# Patient Record
Sex: Female | Born: 1965
Health system: Southern US, Community
[De-identification: ages and names within clinical notes are randomized; demographics above are authoritative.]

## PROBLEM LIST (undated history)

## (undated) DIAGNOSIS — I10 Essential (primary) hypertension: Secondary | ICD-10-CM

## (undated) DIAGNOSIS — R569 Unspecified convulsions: Secondary | ICD-10-CM

## (undated) DIAGNOSIS — C801 Malignant (primary) neoplasm, unspecified: Secondary | ICD-10-CM

## (undated) DIAGNOSIS — E349 Endocrine disorder, unspecified: Secondary | ICD-10-CM

## (undated) DIAGNOSIS — E7800 Pure hypercholesterolemia, unspecified: Secondary | ICD-10-CM

## (undated) HISTORY — PX: MOLE REMOVAL: SHX2046

## (undated) HISTORY — DX: Endocrine disorder, unspecified: E34.9

## (undated) HISTORY — PX: TRIGGER FINGER RELEASE: SHX641

## (undated) HISTORY — DX: Pure hypercholesterolemia, unspecified: E78.00

## (undated) HISTORY — DX: Malignant (primary) neoplasm, unspecified: C80.1

## (undated) HISTORY — DX: Unspecified convulsions: R56.9

## (undated) HISTORY — DX: Essential (primary) hypertension: I10

## (undated) HISTORY — PX: NASAL SINUS SURGERY: SHX719

## (undated) HISTORY — PX: BREAST CYST EXCISION: SHX579

---

## 1998-06-21 ENCOUNTER — Other Ambulatory Visit: Admission: RE | Admit: 1998-06-21 | Discharge: 1998-06-21 | Payer: Self-pay | Admitting: Gynecology

## 1999-03-13 ENCOUNTER — Other Ambulatory Visit: Admission: RE | Admit: 1999-03-13 | Discharge: 1999-03-13 | Payer: Self-pay | Admitting: Gynecology

## 2000-03-12 ENCOUNTER — Other Ambulatory Visit: Admission: RE | Admit: 2000-03-12 | Discharge: 2000-03-12 | Payer: Self-pay | Admitting: Gynecology

## 2000-04-08 ENCOUNTER — Ambulatory Visit (HOSPITAL_COMMUNITY): Admission: RE | Admit: 2000-04-08 | Discharge: 2000-04-08 | Payer: Self-pay | Admitting: Gynecology

## 2000-04-08 ENCOUNTER — Encounter: Payer: Self-pay | Admitting: Gynecology

## 2001-03-12 ENCOUNTER — Other Ambulatory Visit: Admission: RE | Admit: 2001-03-12 | Discharge: 2001-03-12 | Payer: Self-pay | Admitting: Gynecology

## 2001-07-22 ENCOUNTER — Ambulatory Visit (HOSPITAL_COMMUNITY): Admission: RE | Admit: 2001-07-22 | Discharge: 2001-07-22 | Payer: Self-pay | Admitting: Gastroenterology

## 2002-03-29 ENCOUNTER — Other Ambulatory Visit: Admission: RE | Admit: 2002-03-29 | Discharge: 2002-03-29 | Payer: Self-pay | Admitting: Gynecology

## 2003-08-16 ENCOUNTER — Other Ambulatory Visit: Admission: RE | Admit: 2003-08-16 | Discharge: 2003-08-16 | Payer: Self-pay | Admitting: Gynecology

## 2004-08-20 ENCOUNTER — Other Ambulatory Visit: Admission: RE | Admit: 2004-08-20 | Discharge: 2004-08-20 | Payer: Self-pay | Admitting: Gynecology

## 2005-08-22 ENCOUNTER — Other Ambulatory Visit: Admission: RE | Admit: 2005-08-22 | Discharge: 2005-08-22 | Payer: Self-pay | Admitting: Gynecology

## 2006-08-26 ENCOUNTER — Other Ambulatory Visit: Admission: RE | Admit: 2006-08-26 | Discharge: 2006-08-26 | Payer: Self-pay | Admitting: Gynecology

## 2007-11-25 DIAGNOSIS — C801 Malignant (primary) neoplasm, unspecified: Secondary | ICD-10-CM

## 2007-11-25 HISTORY — DX: Malignant (primary) neoplasm, unspecified: C80.1

## 2007-11-25 HISTORY — PX: OTHER SURGICAL HISTORY: SHX169

## 2009-01-24 ENCOUNTER — Ambulatory Visit: Payer: Self-pay | Admitting: Gynecology

## 2009-01-24 ENCOUNTER — Encounter: Payer: Self-pay | Admitting: Gynecology

## 2009-01-24 ENCOUNTER — Encounter: Admission: RE | Admit: 2009-01-24 | Discharge: 2009-01-24 | Payer: Self-pay | Admitting: Gynecology

## 2009-01-24 ENCOUNTER — Other Ambulatory Visit: Admission: RE | Admit: 2009-01-24 | Discharge: 2009-01-24 | Payer: Self-pay | Admitting: Gynecology

## 2010-01-28 ENCOUNTER — Other Ambulatory Visit: Admission: RE | Admit: 2010-01-28 | Discharge: 2010-01-28 | Payer: Self-pay | Admitting: Gynecology

## 2010-01-28 ENCOUNTER — Ambulatory Visit: Payer: Self-pay | Admitting: Gynecology

## 2010-01-28 ENCOUNTER — Encounter: Admission: RE | Admit: 2010-01-28 | Discharge: 2010-01-28 | Payer: Self-pay | Admitting: Gynecology

## 2011-02-06 ENCOUNTER — Other Ambulatory Visit: Payer: Self-pay | Admitting: Gynecology

## 2011-02-06 DIAGNOSIS — Z1231 Encounter for screening mammogram for malignant neoplasm of breast: Secondary | ICD-10-CM

## 2011-02-14 ENCOUNTER — Other Ambulatory Visit (HOSPITAL_COMMUNITY)
Admission: RE | Admit: 2011-02-14 | Discharge: 2011-02-14 | Disposition: A | Discharge status: Home / Self Care | Payer: PRIVATE HEALTH INSURANCE | Source: Ambulatory Visit | Attending: Gynecology | Admitting: Gynecology

## 2011-02-14 ENCOUNTER — Ambulatory Visit
Admission: RE | Admit: 2011-02-14 | Discharge: 2011-02-14 | Disposition: A | Discharge status: Home / Self Care | Payer: PRIVATE HEALTH INSURANCE | Source: Ambulatory Visit | Attending: Gynecology | Admitting: Gynecology

## 2011-02-14 ENCOUNTER — Encounter (INDEPENDENT_AMBULATORY_CARE_PROVIDER_SITE_OTHER): Payer: PRIVATE HEALTH INSURANCE | Admitting: Gynecology

## 2011-02-14 ENCOUNTER — Other Ambulatory Visit: Payer: Self-pay | Admitting: Gynecology

## 2011-02-14 DIAGNOSIS — Z124 Encounter for screening for malignant neoplasm of cervix: Secondary | ICD-10-CM | POA: Insufficient documentation

## 2011-02-14 DIAGNOSIS — B3731 Acute candidiasis of vulva and vagina: Secondary | ICD-10-CM

## 2011-02-14 DIAGNOSIS — Z01419 Encounter for gynecological examination (general) (routine) without abnormal findings: Secondary | ICD-10-CM

## 2011-02-14 DIAGNOSIS — R141 Gas pain: Secondary | ICD-10-CM

## 2011-02-14 DIAGNOSIS — Z1231 Encounter for screening mammogram for malignant neoplasm of breast: Secondary | ICD-10-CM

## 2011-02-14 DIAGNOSIS — B373 Candidiasis of vulva and vagina: Secondary | ICD-10-CM

## 2011-02-14 DIAGNOSIS — L909 Atrophic disorder of skin, unspecified: Secondary | ICD-10-CM

## 2011-02-14 DIAGNOSIS — D259 Leiomyoma of uterus, unspecified: Secondary | ICD-10-CM

## 2011-04-11 NOTE — Procedures (Signed)
[place]. [facility]  Patient:    Rachael Sanford, Rachael Sanford Visit Number: [id number] MRN: [id number]          Service Type: END Location: ENDO Attending Physician:  Charna Elizabeth Proc. Date: 07/22/01 Admit Date:  07/22/2001   CC:         Juan H. Lily Peer, M.D.  Dr. Desmond Dike   Procedure Report  DATE OF BIRTH:  [birth year]  REFERRING PHYSICIAN:  Gaetano Hawthorne. Lily Peer, M.D.  PROCEDURE PERFORMED:  Screening colonoscopy.  ENDOSCOPIST:  Anselmo Rod, M.D.  INSTRUMENT USED:  Olympus video colonoscope.  INDICATIONS FOR PROCEDURE:  The patient is a 45 year old white female with  a family history of colon cancer.  Rule out colonic polyps.  PREPROCEDURE PREPARATION:  Informed consent was procured from the patient. The patient was fasted for eight hours prior to the procedure and prepped with a bottle of magnesium citrate and a gallon of NuLytely the night prior to the procedure.  PREPROCEDURE PHYSICAL:  The patient had stable vital signs.  Neck supple. Chest clear to auscultation.  S1, S2 regular.  Abdomen soft with normal abdominal bowel sounds.  DESCRIPTION OF PROCEDURE:  The patient was placed in the left lateral decubitus position and sedated with 50 mg of Demerol and 5 mg of Versed intravenously.  Once the patient was adequately sedated and maintained on low-flow oxygen and continuous cardiac monitoring, the Olympus video colonoscope was advanced from the rectum to the cecum without difficulty.  The patient had a fairly good prep, no masses, polyps, erosions, ulcerations or diverticula were seen.  There was no evidence of hemorrhoids.  The cecum was clearly visualized.  The appendiceal orifice and the ileocecal valve were photographed.  IMPRESSION:  Healthy-appearing colon.  RECOMMENDATIONS:  Repeat colorectal cancer screening is recommended in five years unless the patient were to develop any abnormal symptoms in the interim.Attending Physician:   Charna Elizabeth DD:  07/22/01 TD:  07/22/01 Job: [id number]

## 2012-02-05 ENCOUNTER — Other Ambulatory Visit: Payer: Self-pay | Admitting: *Deleted

## 2012-02-05 MED ORDER — LEVONORGESTREL-ETHINYL ESTRAD 0.1-20 MG-MCG PO TABS: 1.0000 | ORAL_TABLET | Freq: Every day | ORAL | Status: DC

## 2012-02-16 ENCOUNTER — Encounter: Payer: PRIVATE HEALTH INSURANCE | Admitting: Gynecology

## 2012-02-19 DIAGNOSIS — E7800 Pure hypercholesterolemia, unspecified: Secondary | ICD-10-CM | POA: Insufficient documentation

## 2012-02-24 ENCOUNTER — Encounter: Payer: Self-pay | Admitting: Gynecology

## 2012-02-24 ENCOUNTER — Ambulatory Visit (INDEPENDENT_AMBULATORY_CARE_PROVIDER_SITE_OTHER): Payer: Medicaid Other | Admitting: Gynecology

## 2012-02-24 VITALS — BP 138/82 | Ht 65.0 in | Wt 288.0 lb

## 2012-02-24 DIAGNOSIS — Z01419 Encounter for gynecological examination (general) (routine) without abnormal findings: Secondary | ICD-10-CM

## 2012-02-24 DIAGNOSIS — N920 Excessive and frequent menstruation with regular cycle: Secondary | ICD-10-CM

## 2012-02-24 DIAGNOSIS — E663 Overweight: Secondary | ICD-10-CM

## 2012-02-24 DIAGNOSIS — E119 Type 2 diabetes mellitus without complications: Secondary | ICD-10-CM

## 2012-02-24 DIAGNOSIS — N946 Dysmenorrhea, unspecified: Secondary | ICD-10-CM

## 2012-02-24 DIAGNOSIS — E282 Polycystic ovarian syndrome: Secondary | ICD-10-CM

## 2012-02-24 HISTORY — DX: Polycystic ovarian syndrome: E28.2

## 2012-02-24 HISTORY — DX: Type 2 diabetes mellitus without complications: E11.9

## 2012-02-24 HISTORY — DX: Excessive and frequent menstruation with regular cycle: N92.0

## 2012-02-24 HISTORY — DX: Dysmenorrhea, unspecified: N94.6

## 2012-02-24 HISTORY — DX: Overweight: E66.3

## 2012-02-24 MED ORDER — LEVONORGESTREL-ETHINYL ESTRAD 0.1-20 MG-MCG PO TABS: 1.0000 | ORAL_TABLET | Freq: Every day | ORAL | Status: DC

## 2012-02-24 NOTE — Patient Instructions (Signed)
Intrauterine Device Information An intrauterine device (IUD) is inserted into your uterus and prevents pregnancy. There are 2 types of IUDs available:  Copper IUD. This type of IUD is wrapped in copper wire and is placed inside the uterus. Copper makes the uterus and fallopian tubes produce a fluid that kills sperm. The copper IUD can stay in place for 10 years.   Hormone IUD. This type of IUD contains the hormone progestin (synthetic progesterone). The hormone thickens the cervical mucus and prevents sperm from entering the uterus, and it also thins the uterine lining to prevent implantation of a fertilized egg. The hormone can weaken or kill the sperm that get into the uterus. The hormone IUD can stay in place for 5 years.  Your caregiver will make sure you are a good candidate for a contraceptive IUD. Discuss with your caregiver the possible side effects. ADVANTAGES  It is highly effective, reversible, long-acting, and low maintenance.   There are no estrogen-related side effects.   An IUD can be used when breastfeeding.   It is not associated with weight gain.   It works immediately after insertion.   The copper IUD does not interfere with your female hormones.   The progesterone IUD can make heavy menstrual periods lighter.   The progesterone IUD can be used for 5 years.   The copper IUD can be used for 10 years.  DISADVANTAGES  The progesterone IUD can be associated with irregular bleeding patterns.   The copper IUD can make your menstrual flow heavier and more painful.   You may experience cramping and vaginal bleeding after insertion.  Document Released: 10/14/2004 Document Revised: 10/30/2011 Document Reviewed: 03/15/2011 ExitCare Patient Information 2012 ExitCare, [place]   Remember to schedule your mammogram this year

## 2012-02-24 NOTE — Progress Notes (Signed)
Rachael Sanford 10/20/1966 [id number]   History:    46 y.o.  for annual exam who has history of type 2 diabetes, hypercholesterolemia, obesity, PCO S. she has not followup with her internist in over year since she has been unemployed. When asked about her fingerstick blood sugars at home she states it sometimes they go as high as 200. Patient is on low-dose oral contraceptive pill (20 mcg of ethinyl estradiol) because of her severe dysmenorrhea and menorrhagia and PCO S. symptoms. Patient is a nonsmoker. Her last mammogram was in March 2012. Her menstrual cycles reported to be regular. Past medical history,surgical history, family history and social history were all reviewed and documented in the EPIC chart.  Gynecologic History Patient's last menstrual period was 02/01/2012. Contraception: OCP (estrogen/progesterone) Last Pap: 2012. Results were: normal Last mammogram: 2012. Results were: normal  Obstetric History OB History    Grav Para Term Preterm Abortions TAB SAB Ect Mult Living   0                ROS:  Was performed and pertinent positives and negatives are included in the history.  Exam: chaperone present  BP 138/82  Ht 5\' 5"  (1.651 m)  Wt 288 lb (130.636 kg)  BMI 47.93 kg/m2  LMP 02/01/2012  Body mass index is 47.93 kg/(m^2).  General appearance : Well developed well nourished female. No acute distress HEENT: Neck supple, trachea midline, no carotid bruits, no thyroidmegaly Lungs: Clear to auscultation, no rhonchi or wheezes, or rib retractions  Heart: Regular rate and rhythm, no murmurs or gallops Breast:Examined in sitting and supine position were symmetrical in appearance, no palpable masses or tenderness,  no skin retraction, no nipple inversion, no nipple discharge, no skin discoloration, no axillary or supraclavicular lymphadenopathy Abdomen: no palpable masses or tenderness, no rebound or guarding Extremities: no edema or skin discoloration or  tenderness  Pelvic:  Bartholin, Urethra, Skene Glands: Within normal limits             Vagina: No gross lesions or discharge  Cervix: No gross lesions or discharge  Uterus difficult to assess due to patient's abdominal girth and morbid obesity  Adnexa difficult to assess due to patient's abdominal girth and morbid obesity  Anus and perineum  normal   Rectovaginal  normal sphincter tone without palpated masses or tenderness             Hemoccult not indicated, digital rectal exam no palpable masses     Assessment/Plan:  46 y.o. female for annual exam with metabolic syndrome/PCO S. and morbid obesity. Bimanual examination very limited due to patient's abdominal girth (BMI 47.93). Patient will return back in 2 weeks for an ultrasound for better assessment of her uterus and adnexa. Pap smear not done today. New screening guidelines discussed. She will make arrangements to followup with her internist in [facility] as to her hypercholesterolemia and diabetes for blood work and adjustment of medications. Requisition was provided for her to schedule her mammogram. We discussed once again the risk of oral contraceptive pills to include the risk of DVT and pulmonary embolism. She is a high risk as a result of her metabolic syndrome and obesity. She states that she cannot do without them  because her menstrual cycles are very heavy with severe cramping and mood swings and accepts the risks. Will followup in 2 weeks for the ultrasound.    Ok Edwards MD, 2:01 PM 02/24/2012

## 2012-03-10 ENCOUNTER — Ambulatory Visit (INDEPENDENT_AMBULATORY_CARE_PROVIDER_SITE_OTHER): Payer: Medicaid Other

## 2012-03-10 ENCOUNTER — Ambulatory Visit (INDEPENDENT_AMBULATORY_CARE_PROVIDER_SITE_OTHER): Payer: Medicaid Other | Admitting: Gynecology

## 2012-03-10 DIAGNOSIS — E282 Polycystic ovarian syndrome: Secondary | ICD-10-CM

## 2012-03-10 DIAGNOSIS — N946 Dysmenorrhea, unspecified: Secondary | ICD-10-CM

## 2012-03-10 DIAGNOSIS — D251 Intramural leiomyoma of uterus: Secondary | ICD-10-CM

## 2012-03-10 DIAGNOSIS — E663 Overweight: Secondary | ICD-10-CM

## 2012-03-10 DIAGNOSIS — D259 Leiomyoma of uterus, unspecified: Secondary | ICD-10-CM

## 2012-03-10 DIAGNOSIS — N920 Excessive and frequent menstruation with regular cycle: Secondary | ICD-10-CM

## 2012-03-10 MED ORDER — LEVONORGESTREL-ETHINYL ESTRAD 0.1-20 MG-MCG PO TABS: 1.0000 | ORAL_TABLET | Freq: Every day | ORAL | Status: DC

## 2012-03-10 NOTE — Progress Notes (Signed)
Patient 46 year old morbidly obese with metabolic syndrome presented today for an ultrasound due to limited exam at time of her annual gynecological examination on April 2. Patient doing well otherwise and it was recommended she followup with her internist because of her wide swings in her blood sugar fingerstick which she had tested at home which she had reported to me on last office visit. She stated i it had been as high as 200 at one time. See patient medication list.  Ultrasound uterus measured 9 x 5.8 x 4.6 cm endometrial stripe 4.3 mm a small fundal myoma measuring 24 x 26 mm was noted displacing the endometrial cavity right and left or otherwise normal. This fibroid has been same size for several years.  Because of patient's PCO S. she has been on low dose oral contraceptive pill with a 20 mcg pill. We had offered her to consider an IUD because of her history of being obese with metabolic syndrome and her potential risk of DVT and pulmonary embolism. She states that she accepts the risk and would like to continue on her oral contraceptive pill because it has helped regulate her cycles and has minimized her cramping as well. The Alesse 28 day oral contraceptive pill was prescribed. We'll see her back in one year or when necessary. She will followup with her internist.

## 2012-03-10 NOTE — Patient Instructions (Signed)
Cholesterol Control Diet  Cholesterol levels in your body are determined significantly by your diet. Cholesterol levels may also be related to heart disease. The following material helps to explain this relationship and discusses what you can do to help keep your heart healthy. Not all cholesterol is bad. Low-density lipoprotein (LDL) cholesterol is the "bad" cholesterol. It may cause fatty deposits to build up inside your arteries. High-density lipoprotein (HDL) cholesterol is "good." It helps to remove the "bad" LDL cholesterol from your blood. Cholesterol is a very important risk factor for heart disease. Other risk factors are high blood pressure, smoking, stress, heredity, and weight. The heart muscle gets its supply of blood through the coronary arteries. If your LDL cholesterol is high and your HDL cholesterol is low, you are at risk for having fatty deposits build up in your coronary arteries. This leaves less room through which blood can flow. Without sufficient blood and oxygen, the heart muscle cannot function properly and you may feel chest pains (angina pectoris). When a coronary artery closes up entirely, a part of the heart muscle may die, causing a heart attack (myocardial infarction). CHECKING CHOLESTEROL When your caregiver sends your blood to a lab to be analyzed for cholesterol, a complete lipid (fat) profile may be done. With this test, the total amount of cholesterol and levels of LDL and HDL are determined. Triglycerides are a type of fat that circulates in the blood and can also be used to determine heart disease risk. The list below describes what the numbers should be: Test: Total Cholesterol.  Less than 200 mg/dl.  Test: LDL "bad cholesterol."  Less than 100 mg/dl.   Less than 70 mg/dl if you are at very high risk of a heart attack or sudden cardiac death.  Test: HDL "good cholesterol."  Greater than 50 mg/dl for women.    Greater than 40 mg/dl for men.  Test: Triglycerides.  Less than 150 mg/dl.  CONTROLLING CHOLESTEROL WITH DIET Although exercise and lifestyle factors are important, your diet is key. That is because certain foods are known to raise cholesterol and others to lower it. The goal is to balance foods for their effect on cholesterol and more importantly, to replace saturated and trans fat with other types of fat, such as monounsaturated fat, polyunsaturated fat, and omega-3 fatty acids. On average, a person should consume no more than 15 to 17 g of saturated fat daily. Saturated and trans fats are considered "bad" fats, and they will raise LDL cholesterol. Saturated fats are primarily found in animal products such as meats, butter, and cream. However, that does not mean you need to sacrifice all your favorite foods. Today, there are good tasting, low-fat, low-cholesterol substitutes for most of the things you like to eat. Choose low-fat or nonfat alternatives. Choose round or loin cuts of red meat, since these types of cuts are lowest in fat and cholesterol. Chicken (without the skin), fish, veal, and ground turkey breast are excellent choices. Eliminate fatty meats, such as hot dogs and salami. Even shellfish have little or no saturated fat. Have a 3 oz (85 g) portion when you eat lean meat, poultry, or fish. Trans fats are also called "partially hydrogenated oils." They are oils that have been scientifically manipulated so that they are solid at room temperature resulting in a longer shelf life and improved taste and texture of foods in which they are added. Trans fats are found in stick margarine, some tub margarines, cookies, crackers, and baked goods.    When baking and cooking, oils are an excellent substitute for butter. The monounsaturated oils are especially beneficial since it is believed they lower LDL and raise HDL. The oils you should avoid entirely are saturated tropical oils, such as coconut and  palm.  Remember to eat liberally from food groups that are naturally free of saturated and trans fat, including fish, fruit, vegetables, beans, grains (barley, rice, couscous, bulgur wheat), and pasta (without cream sauces).  IDENTIFYING FOODS THAT LOWER CHOLESTEROL  Soluble fiber may lower your cholesterol. This type of fiber is found in fruits such as apples, vegetables such as broccoli, potatoes, and carrots, legumes such as beans, peas, and lentils, and grains such as barley. Foods fortified with plant sterols (phytosterol) may also lower cholesterol. You should eat at least 2 g per day of these foods for a cholesterol lowering effect.  Read package labels to identify low-saturated fats, trans fats free, and low-fat foods at the supermarket. Select cheeses that have only 2 to 3 g saturated fat per ounce. Use a heart-healthy tub margarine that is free of trans fats or partially hydrogenated oil. When buying baked goods (cookies, crackers), avoid partially hydrogenated oils. Breads and muffins should be made from whole grains (whole-wheat or whole oat flour, instead of "flour" or "enriched flour"). Buy non-creamy canned soups with reduced salt and no added fats.  FOOD PREPARATION TECHNIQUES  Never deep-fry. If you must fry, either stir-fry, which uses very little fat, or use non-stick cooking sprays. When possible, broil, bake, or roast meats, and steam vegetables. Instead of dressing vegetables with butter or margarine, use lemon and herbs, applesauce and cinnamon (for squash and sweet potatoes), nonfat yogurt, salsa, and low-fat dressings for salads.  LOW-SATURATED FAT / LOW-FAT FOOD SUBSTITUTES Meats / Saturated Fat (g)  Avoid: Steak, marbled (3 oz/85 g) / 11 g   Choose: Steak, lean (3 oz/85 g) / 4 g   Avoid: Hamburger (3 oz/85 g) / 7 g   Choose: Hamburger, lean (3 oz/85 g) / 5 g   Avoid: Ham (3 oz/85 g) / 6 g   Choose: Ham, lean cut (3 oz/85 g) / 2.4 g   Avoid: Chicken, with skin, dark  meat (3 oz/85 g) / 4 g   Choose: Chicken, skin removed, dark meat (3 oz/85 g) / 2 g   Avoid: Chicken, with skin, light meat (3 oz/85 g) / 2.5 g   Choose: Chicken, skin removed, light meat (3 oz/85 g) / 1 g  Dairy / Saturated Fat (g)  Avoid: Whole milk (1 cup) / 5 g   Choose: Low-fat milk, 2% (1 cup) / 3 g   Choose: Low-fat milk, 1% (1 cup) / 1.5 g   Choose: Skim milk (1 cup) / 0.3 g   Avoid: Hard cheese (1 oz/28 g) / 6 g   Choose: Skim milk cheese (1 oz/28 g) / 2 to 3 g   Avoid: Cottage cheese, 4% fat (1 cup) / 6.5 g   Choose: Low-fat cottage cheese, 1% fat (1 cup) / 1.5 g   Avoid: Ice cream (1 cup) / 9 g   Choose: Sherbet (1 cup) / 2.5 g   Choose: Nonfat frozen yogurt (1 cup) / 0.3 g   Choose: Frozen fruit bar / trace   Avoid: Whipped cream (1 tbs) / 3.5 g   Choose: Nondairy whipped topping (1 tbs) / 1 g  Condiments / Saturated Fat (g)  Avoid: Mayonnaise (1 tbs) / 2 g   Choose: Low-fat   mayonnaise (1 tbs) / 1 g   Avoid: Butter (1 tbs) / 7 g   Choose: Extra light margarine (1 tbs) / 1 g   Avoid: Coconut oil (1 tbs) / 11.8 g   Choose: Olive oil (1 tbs) / 1.8 g   Choose: Corn oil (1 tbs) / 1.7 g   Choose: Safflower oil (1 tbs) / 1.2 g   Choose: Sunflower oil (1 tbs) / 1.4 g   Choose: Soybean oil (1 tbs) / 2.4 g   Choose: Canola oil (1 tbs) / 1 g  Document Released: 11/10/2005 Document Revised: 07/23/2011 Document Reviewed: 05/01/2011 Exit[employer] Patient Information 2012 [employer].  Exercise to Lose Weight Exercise and a healthy diet may help you lose weight. Your doctor may suggest specific exercises. EXERCISE IDEAS AND TIPS  Choose low-cost things you enjoy doing, such as walking, bicycling, or exercising to workout videos.   Take stairs instead of the elevator.   Walk during your lunch break.   Park your car further away from work or school.   Go to a gym or an exercise class.   Start with 5 to 10 minutes of exercise each day. Build up to  30 minutes of exercise 4 to 6 days a week.   Wear shoes with good support and comfortable clothes.   Stretch before and after working out.   Work out until you breathe harder and your heart beats faster.   Drink extra water when you exercise.   Do not do so much that you hurt yourself, feel dizzy, or get very short of breath.  Exercises that burn about 150 calories:  Running 1  miles in 15 minutes.   Playing volleyball for 45 to 60 minutes.   Washing and waxing a car for 45 to 60 minutes.   Playing touch football for 45 minutes.   Walking 1  miles in 35 minutes.   Pushing a stroller 1  miles in 30 minutes.   Playing basketball for 30 minutes.   Raking leaves for 30 minutes.   Bicycling 5 miles in 30 minutes.   Walking 2 miles in 30 minutes.   Dancing for 30 minutes.   Shoveling snow for 15 minutes.   Swimming laps for 20 minutes.   Walking up stairs for 15 minutes.   Bicycling 4 miles in 15 minutes.   Gardening for 30 to 45 minutes.   Jumping rope for 15 minutes.   Washing windows or floors for 45 to 60 minutes.  Document Released: 12/13/2010 Document Revised: 07/23/2011 Document Reviewed: 12/13/2010 [employer] Patient Information 2012 [employer].  

## 2014-08-17 ENCOUNTER — Other Ambulatory Visit: Payer: Self-pay

## 2014-08-17 DIAGNOSIS — Z1231 Encounter for screening mammogram for malignant neoplasm of breast: Secondary | ICD-10-CM

## 2014-09-06 ENCOUNTER — Ambulatory Visit
Admission: RE | Admit: 2014-09-06 | Discharge: 2014-09-06 | Disposition: A | Discharge status: Home / Self Care | Payer: PRIVATE HEALTH INSURANCE | Source: Ambulatory Visit

## 2014-09-06 DIAGNOSIS — Z1231 Encounter for screening mammogram for malignant neoplasm of breast: Secondary | ICD-10-CM

## 2015-07-05 ENCOUNTER — Other Ambulatory Visit: Payer: Self-pay

## 2015-07-05 DIAGNOSIS — Z1231 Encounter for screening mammogram for malignant neoplasm of breast: Secondary | ICD-10-CM

## 2015-09-11 ENCOUNTER — Ambulatory Visit
Admission: RE | Admit: 2015-09-11 | Discharge: 2015-09-11 | Disposition: A | Discharge status: Home / Self Care | Payer: BLUE CROSS/BLUE SHIELD | Source: Ambulatory Visit

## 2015-09-11 DIAGNOSIS — Z1231 Encounter for screening mammogram for malignant neoplasm of breast: Secondary | ICD-10-CM

## 2016-02-24 DIAGNOSIS — J0100 Acute maxillary sinusitis, unspecified: Secondary | ICD-10-CM | POA: Diagnosis not present

## 2016-04-06 DIAGNOSIS — J0100 Acute maxillary sinusitis, unspecified: Secondary | ICD-10-CM | POA: Diagnosis not present

## 2016-05-05 DIAGNOSIS — Z800 Family history of malignant neoplasm of digestive organs: Secondary | ICD-10-CM | POA: Diagnosis not present

## 2016-05-20 DIAGNOSIS — J0100 Acute maxillary sinusitis, unspecified: Secondary | ICD-10-CM | POA: Diagnosis not present

## 2016-05-20 DIAGNOSIS — H6980 Other specified disorders of Eustachian tube, unspecified ear: Secondary | ICD-10-CM | POA: Diagnosis not present

## 2016-06-02 DIAGNOSIS — J329 Chronic sinusitis, unspecified: Secondary | ICD-10-CM | POA: Diagnosis not present

## 2016-06-02 DIAGNOSIS — J019 Acute sinusitis, unspecified: Secondary | ICD-10-CM | POA: Diagnosis not present

## 2016-06-06 DIAGNOSIS — E7800 Pure hypercholesterolemia, unspecified: Secondary | ICD-10-CM | POA: Diagnosis not present

## 2016-06-06 DIAGNOSIS — Z794 Long term (current) use of insulin: Secondary | ICD-10-CM | POA: Diagnosis not present

## 2016-06-06 DIAGNOSIS — K621 Rectal polyp: Secondary | ICD-10-CM | POA: Diagnosis not present

## 2016-06-06 DIAGNOSIS — D122 Benign neoplasm of ascending colon: Secondary | ICD-10-CM | POA: Diagnosis not present

## 2016-06-06 DIAGNOSIS — Z1211 Encounter for screening for malignant neoplasm of colon: Secondary | ICD-10-CM | POA: Diagnosis not present

## 2016-06-06 DIAGNOSIS — K644 Residual hemorrhoidal skin tags: Secondary | ICD-10-CM | POA: Diagnosis not present

## 2016-06-06 DIAGNOSIS — Z79899 Other long term (current) drug therapy: Secondary | ICD-10-CM | POA: Diagnosis not present

## 2016-06-06 DIAGNOSIS — Z7982 Long term (current) use of aspirin: Secondary | ICD-10-CM | POA: Diagnosis not present

## 2016-06-06 DIAGNOSIS — Z800 Family history of malignant neoplasm of digestive organs: Secondary | ICD-10-CM | POA: Diagnosis not present

## 2016-06-06 DIAGNOSIS — E282 Polycystic ovarian syndrome: Secondary | ICD-10-CM | POA: Diagnosis not present

## 2016-06-06 DIAGNOSIS — D126 Benign neoplasm of colon, unspecified: Secondary | ICD-10-CM | POA: Diagnosis not present

## 2016-06-06 DIAGNOSIS — E119 Type 2 diabetes mellitus without complications: Secondary | ICD-10-CM | POA: Diagnosis not present

## 2016-06-06 DIAGNOSIS — I10 Essential (primary) hypertension: Secondary | ICD-10-CM | POA: Diagnosis not present

## 2016-06-10 DIAGNOSIS — E785 Hyperlipidemia, unspecified: Secondary | ICD-10-CM | POA: Diagnosis not present

## 2016-06-10 DIAGNOSIS — E1165 Type 2 diabetes mellitus with hyperglycemia: Secondary | ICD-10-CM | POA: Diagnosis not present

## 2016-06-10 DIAGNOSIS — E1140 Type 2 diabetes mellitus with diabetic neuropathy, unspecified: Secondary | ICD-10-CM | POA: Diagnosis not present

## 2016-06-10 DIAGNOSIS — E0840 Diabetes mellitus due to underlying condition with diabetic neuropathy, unspecified: Secondary | ICD-10-CM | POA: Diagnosis not present

## 2016-06-10 DIAGNOSIS — Z794 Long term (current) use of insulin: Secondary | ICD-10-CM | POA: Diagnosis not present

## 2016-06-16 DIAGNOSIS — J329 Chronic sinusitis, unspecified: Secondary | ICD-10-CM | POA: Diagnosis not present

## 2016-06-16 DIAGNOSIS — J019 Acute sinusitis, unspecified: Secondary | ICD-10-CM | POA: Diagnosis not present

## 2016-06-23 ENCOUNTER — Other Ambulatory Visit: Payer: Self-pay | Admitting: Family Medicine

## 2016-06-23 DIAGNOSIS — Z1231 Encounter for screening mammogram for malignant neoplasm of breast: Secondary | ICD-10-CM

## 2016-06-25 DIAGNOSIS — E119 Type 2 diabetes mellitus without complications: Secondary | ICD-10-CM | POA: Diagnosis not present

## 2016-06-25 DIAGNOSIS — I10 Essential (primary) hypertension: Secondary | ICD-10-CM | POA: Diagnosis not present

## 2016-06-25 DIAGNOSIS — H524 Presbyopia: Secondary | ICD-10-CM | POA: Diagnosis not present

## 2016-06-30 DIAGNOSIS — J329 Chronic sinusitis, unspecified: Secondary | ICD-10-CM | POA: Diagnosis not present

## 2016-07-09 DIAGNOSIS — L0390 Cellulitis, unspecified: Secondary | ICD-10-CM | POA: Diagnosis not present

## 2016-07-09 DIAGNOSIS — N611 Abscess of the breast and nipple: Secondary | ICD-10-CM | POA: Diagnosis not present

## 2016-08-31 DIAGNOSIS — J0100 Acute maxillary sinusitis, unspecified: Secondary | ICD-10-CM | POA: Diagnosis not present

## 2016-09-11 ENCOUNTER — Ambulatory Visit
Admission: RE | Admit: 2016-09-11 | Discharge: 2016-09-11 | Disposition: A | Discharge status: Home / Self Care | Payer: BLUE CROSS/BLUE SHIELD | Source: Ambulatory Visit | Attending: Family Medicine | Admitting: Family Medicine

## 2016-09-11 DIAGNOSIS — L600 Ingrowing nail: Secondary | ICD-10-CM | POA: Insufficient documentation

## 2016-09-11 DIAGNOSIS — L84 Corns and callosities: Secondary | ICD-10-CM | POA: Insufficient documentation

## 2016-09-11 DIAGNOSIS — E1142 Type 2 diabetes mellitus with diabetic polyneuropathy: Secondary | ICD-10-CM

## 2016-09-11 DIAGNOSIS — Z1231 Encounter for screening mammogram for malignant neoplasm of breast: Secondary | ICD-10-CM | POA: Diagnosis not present

## 2016-09-11 DIAGNOSIS — E1169 Type 2 diabetes mellitus with other specified complication: Secondary | ICD-10-CM | POA: Diagnosis not present

## 2016-09-11 HISTORY — DX: Corns and callosities: L84

## 2016-09-11 HISTORY — DX: Ingrowing nail: L60.0

## 2016-09-11 HISTORY — DX: Type 2 diabetes mellitus with diabetic polyneuropathy: E11.42

## 2016-09-15 DIAGNOSIS — E1165 Type 2 diabetes mellitus with hyperglycemia: Secondary | ICD-10-CM | POA: Diagnosis not present

## 2016-09-15 DIAGNOSIS — E1140 Type 2 diabetes mellitus with diabetic neuropathy, unspecified: Secondary | ICD-10-CM | POA: Diagnosis not present

## 2016-09-15 DIAGNOSIS — G629 Polyneuropathy, unspecified: Secondary | ICD-10-CM | POA: Diagnosis not present

## 2016-09-15 DIAGNOSIS — Z794 Long term (current) use of insulin: Secondary | ICD-10-CM | POA: Diagnosis not present

## 2016-10-21 DIAGNOSIS — J0100 Acute maxillary sinusitis, unspecified: Secondary | ICD-10-CM | POA: Diagnosis not present

## 2016-10-27 DIAGNOSIS — R0981 Nasal congestion: Secondary | ICD-10-CM | POA: Diagnosis not present

## 2016-10-27 DIAGNOSIS — J342 Deviated nasal septum: Secondary | ICD-10-CM | POA: Diagnosis not present

## 2016-10-27 DIAGNOSIS — J329 Chronic sinusitis, unspecified: Secondary | ICD-10-CM | POA: Diagnosis not present

## 2016-10-27 DIAGNOSIS — J3489 Other specified disorders of nose and nasal sinuses: Secondary | ICD-10-CM | POA: Diagnosis not present

## 2016-11-10 DIAGNOSIS — J209 Acute bronchitis, unspecified: Secondary | ICD-10-CM | POA: Diagnosis not present

## 2016-11-20 DIAGNOSIS — D485 Neoplasm of uncertain behavior of skin: Secondary | ICD-10-CM | POA: Diagnosis not present

## 2016-11-20 DIAGNOSIS — D225 Melanocytic nevi of trunk: Secondary | ICD-10-CM | POA: Diagnosis not present

## 2016-11-20 DIAGNOSIS — D1801 Hemangioma of skin and subcutaneous tissue: Secondary | ICD-10-CM | POA: Diagnosis not present

## 2016-11-25 DIAGNOSIS — E1142 Type 2 diabetes mellitus with diabetic polyneuropathy: Secondary | ICD-10-CM | POA: Diagnosis not present

## 2016-11-25 DIAGNOSIS — L84 Corns and callosities: Secondary | ICD-10-CM | POA: Diagnosis not present

## 2017-01-09 DIAGNOSIS — G629 Polyneuropathy, unspecified: Secondary | ICD-10-CM | POA: Diagnosis not present

## 2017-01-09 DIAGNOSIS — E1165 Type 2 diabetes mellitus with hyperglycemia: Secondary | ICD-10-CM | POA: Diagnosis not present

## 2017-01-09 DIAGNOSIS — E785 Hyperlipidemia, unspecified: Secondary | ICD-10-CM | POA: Diagnosis not present

## 2017-01-09 DIAGNOSIS — Z794 Long term (current) use of insulin: Secondary | ICD-10-CM | POA: Diagnosis not present

## 2017-01-09 DIAGNOSIS — E1140 Type 2 diabetes mellitus with diabetic neuropathy, unspecified: Secondary | ICD-10-CM | POA: Diagnosis not present

## 2017-01-25 DIAGNOSIS — J0100 Acute maxillary sinusitis, unspecified: Secondary | ICD-10-CM | POA: Diagnosis not present

## 2017-01-25 DIAGNOSIS — H6123 Impacted cerumen, bilateral: Secondary | ICD-10-CM | POA: Diagnosis not present

## 2017-02-18 DIAGNOSIS — J0100 Acute maxillary sinusitis, unspecified: Secondary | ICD-10-CM | POA: Diagnosis not present

## 2017-02-24 DIAGNOSIS — L84 Corns and callosities: Secondary | ICD-10-CM | POA: Diagnosis not present

## 2017-02-24 DIAGNOSIS — E1142 Type 2 diabetes mellitus with diabetic polyneuropathy: Secondary | ICD-10-CM | POA: Diagnosis not present

## 2017-02-24 DIAGNOSIS — L600 Ingrowing nail: Secondary | ICD-10-CM | POA: Diagnosis not present

## 2017-05-09 DIAGNOSIS — J0100 Acute maxillary sinusitis, unspecified: Secondary | ICD-10-CM | POA: Diagnosis not present

## 2017-05-11 DIAGNOSIS — J209 Acute bronchitis, unspecified: Secondary | ICD-10-CM | POA: Diagnosis not present

## 2017-05-11 DIAGNOSIS — J0100 Acute maxillary sinusitis, unspecified: Secondary | ICD-10-CM | POA: Diagnosis not present

## 2017-05-11 DIAGNOSIS — J301 Allergic rhinitis due to pollen: Secondary | ICD-10-CM | POA: Diagnosis not present

## 2017-05-15 DIAGNOSIS — J329 Chronic sinusitis, unspecified: Secondary | ICD-10-CM | POA: Diagnosis not present

## 2017-05-15 DIAGNOSIS — J40 Bronchitis, not specified as acute or chronic: Secondary | ICD-10-CM | POA: Diagnosis not present

## 2017-05-15 DIAGNOSIS — Z6841 Body Mass Index (BMI) 40.0 and over, adult: Secondary | ICD-10-CM | POA: Diagnosis not present

## 2017-05-31 DIAGNOSIS — J209 Acute bronchitis, unspecified: Secondary | ICD-10-CM | POA: Diagnosis not present

## 2017-07-02 DIAGNOSIS — Z6841 Body Mass Index (BMI) 40.0 and over, adult: Secondary | ICD-10-CM | POA: Diagnosis not present

## 2017-07-02 DIAGNOSIS — L710 Perioral dermatitis: Secondary | ICD-10-CM | POA: Diagnosis not present

## 2017-07-02 DIAGNOSIS — Z1389 Encounter for screening for other disorder: Secondary | ICD-10-CM | POA: Diagnosis not present

## 2017-07-08 DIAGNOSIS — H524 Presbyopia: Secondary | ICD-10-CM | POA: Diagnosis not present

## 2017-07-08 DIAGNOSIS — E119 Type 2 diabetes mellitus without complications: Secondary | ICD-10-CM | POA: Diagnosis not present

## 2017-07-20 ENCOUNTER — Other Ambulatory Visit: Payer: Self-pay | Admitting: Family Medicine

## 2017-07-20 DIAGNOSIS — Z1231 Encounter for screening mammogram for malignant neoplasm of breast: Secondary | ICD-10-CM

## 2017-07-31 DIAGNOSIS — E1140 Type 2 diabetes mellitus with diabetic neuropathy, unspecified: Secondary | ICD-10-CM | POA: Diagnosis not present

## 2017-07-31 DIAGNOSIS — E1165 Type 2 diabetes mellitus with hyperglycemia: Secondary | ICD-10-CM | POA: Diagnosis not present

## 2017-07-31 DIAGNOSIS — Z794 Long term (current) use of insulin: Secondary | ICD-10-CM | POA: Diagnosis not present

## 2017-07-31 DIAGNOSIS — G629 Polyneuropathy, unspecified: Secondary | ICD-10-CM | POA: Diagnosis not present

## 2017-07-31 DIAGNOSIS — E785 Hyperlipidemia, unspecified: Secondary | ICD-10-CM | POA: Diagnosis not present

## 2017-09-14 ENCOUNTER — Ambulatory Visit
Admission: RE | Admit: 2017-09-14 | Discharge: 2017-09-14 | Disposition: A | Discharge status: Home / Self Care | Payer: BLUE CROSS/BLUE SHIELD | Source: Ambulatory Visit | Attending: Family Medicine | Admitting: Family Medicine

## 2017-09-14 DIAGNOSIS — Z85820 Personal history of malignant melanoma of skin: Secondary | ICD-10-CM | POA: Diagnosis not present

## 2017-09-14 DIAGNOSIS — D485 Neoplasm of uncertain behavior of skin: Secondary | ICD-10-CM | POA: Diagnosis not present

## 2017-09-14 DIAGNOSIS — Z1231 Encounter for screening mammogram for malignant neoplasm of breast: Secondary | ICD-10-CM | POA: Diagnosis not present

## 2017-09-14 DIAGNOSIS — D225 Melanocytic nevi of trunk: Secondary | ICD-10-CM | POA: Diagnosis not present

## 2017-09-15 DIAGNOSIS — Z0000 Encounter for general adult medical examination without abnormal findings: Secondary | ICD-10-CM | POA: Diagnosis not present

## 2017-09-15 DIAGNOSIS — Z6841 Body Mass Index (BMI) 40.0 and over, adult: Secondary | ICD-10-CM | POA: Diagnosis not present

## 2017-10-25 IMAGING — MG 2D DIGITAL SCREENING BILATERAL MAMMOGRAM WITH CAD AND ADJUNCT TO
8 of 14 series · 8 of 30 positions shown · non-contrast
Comparison: Previous exam(s).

CLINICAL DATA: Screening.

EXAM:
2D DIGITAL SCREENING BILATERAL MAMMOGRAM WITH CAD AND ADJUNCT TOMO

[L CC (1 of 2)]
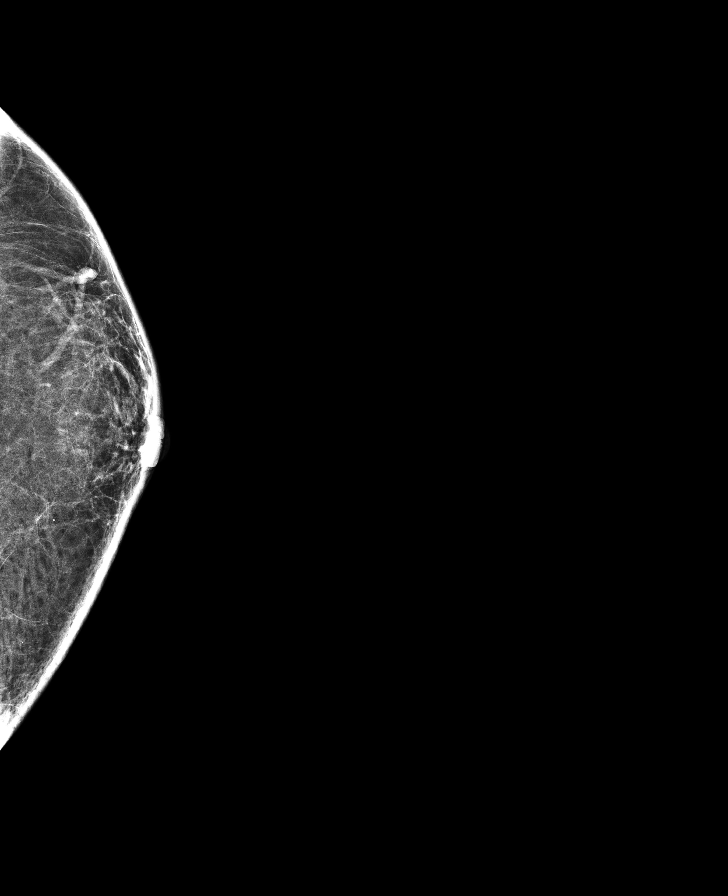

[R CC]
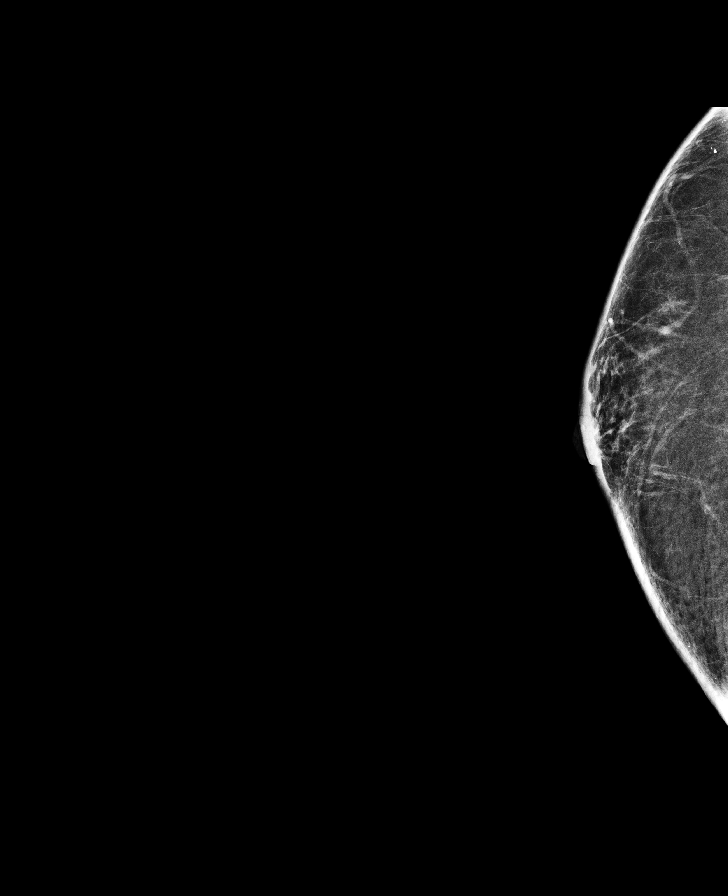

[R CC synth-2D]
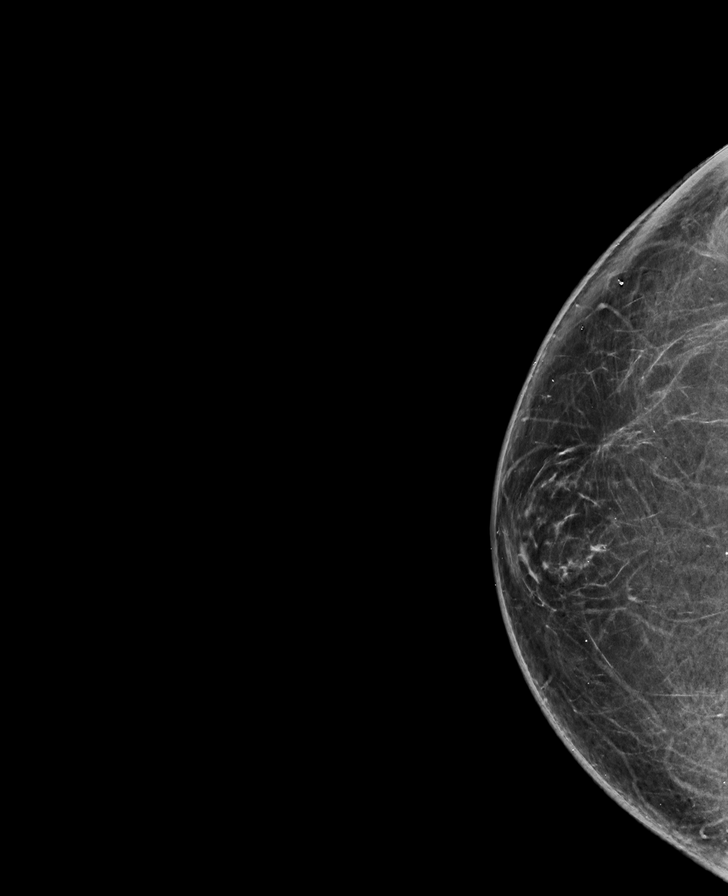

[L MLO]
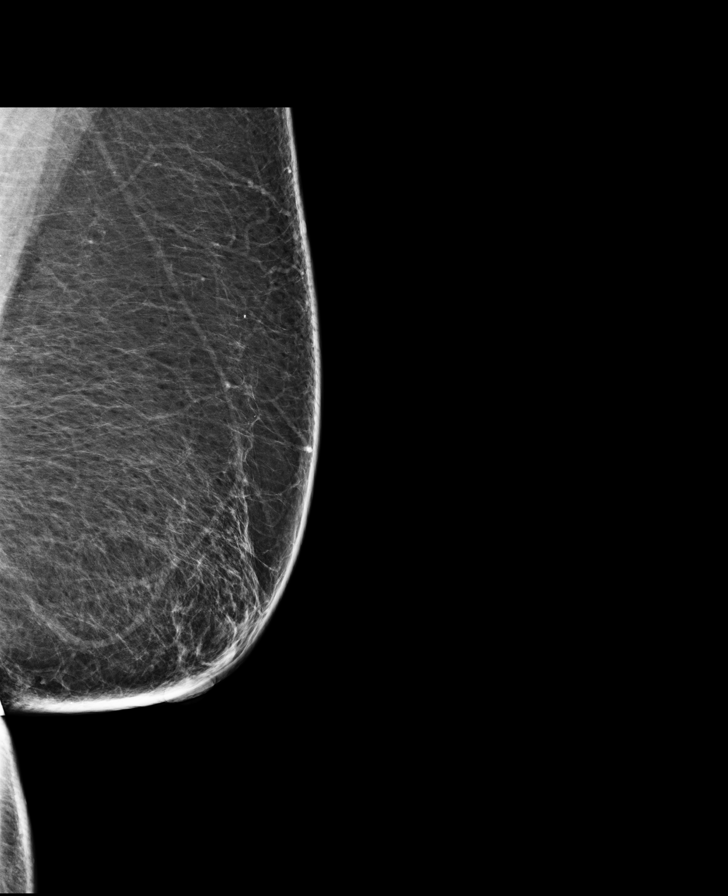

[R MLO synth-2D]
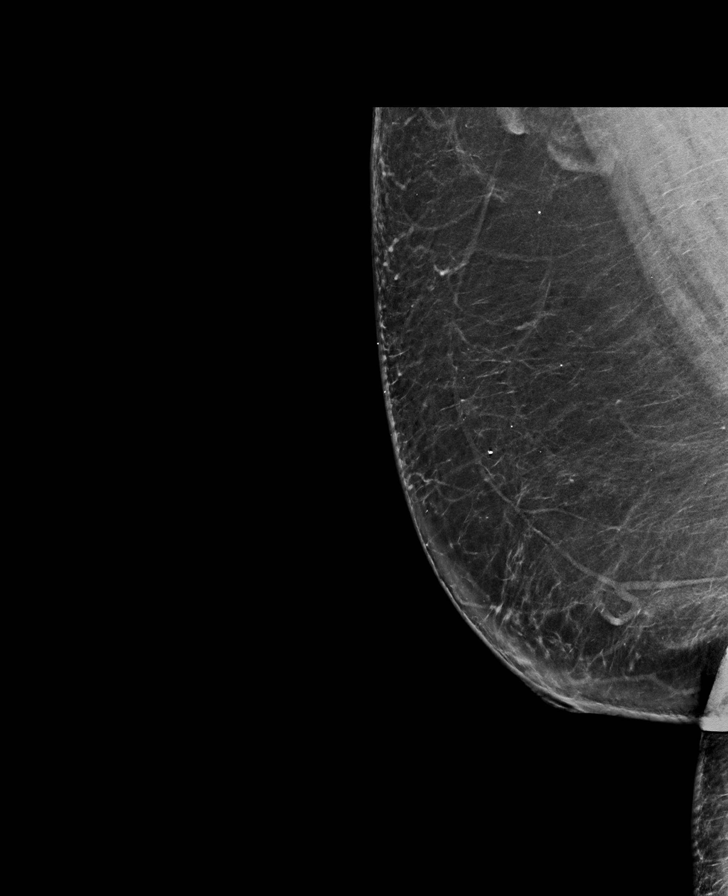

[L MLO synth-2D]
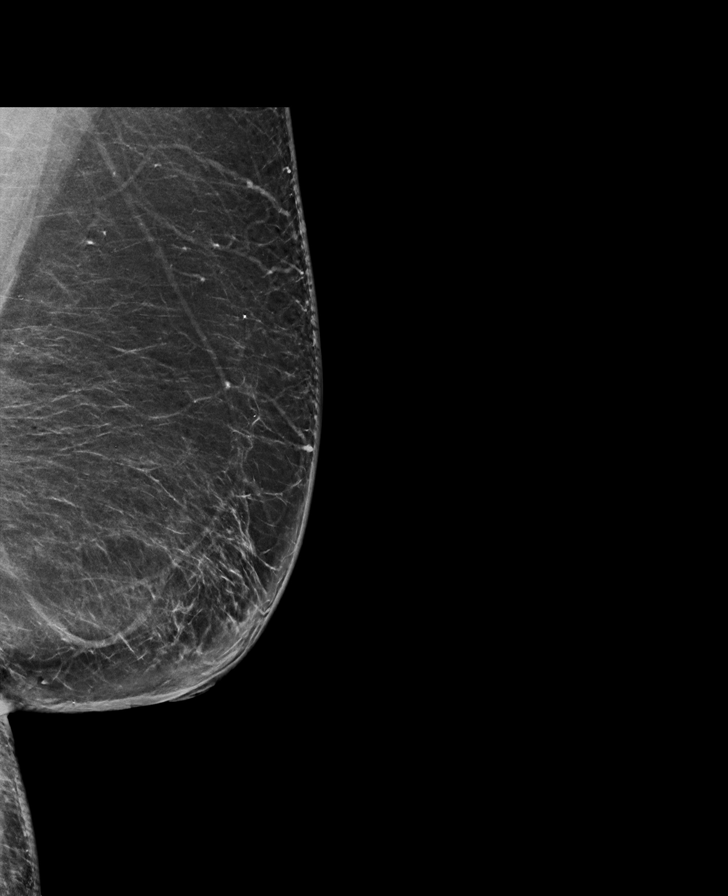

[L CC (2 of 2)]
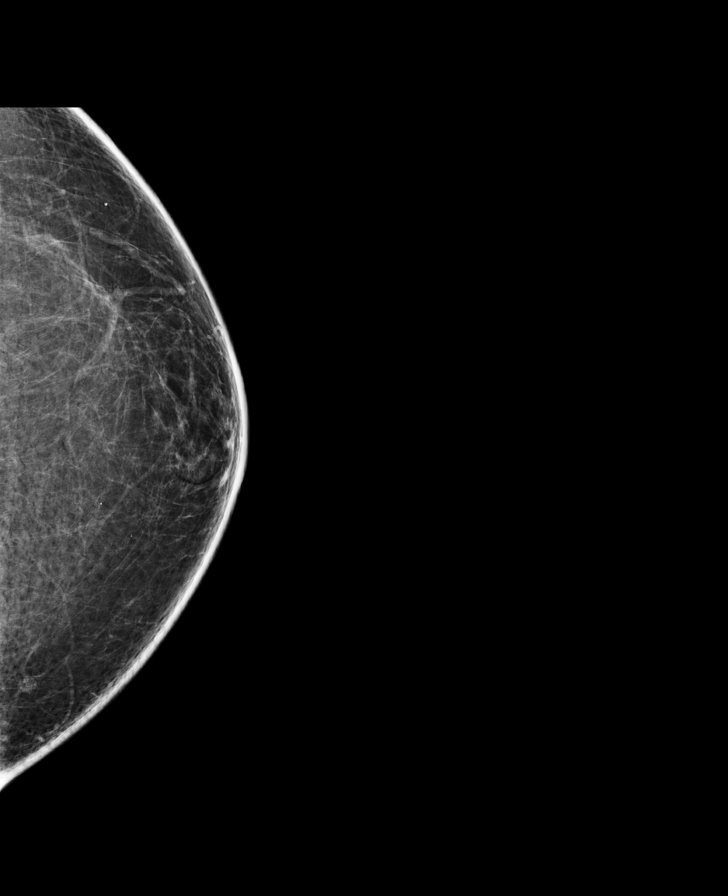

[L CC synth-2D]
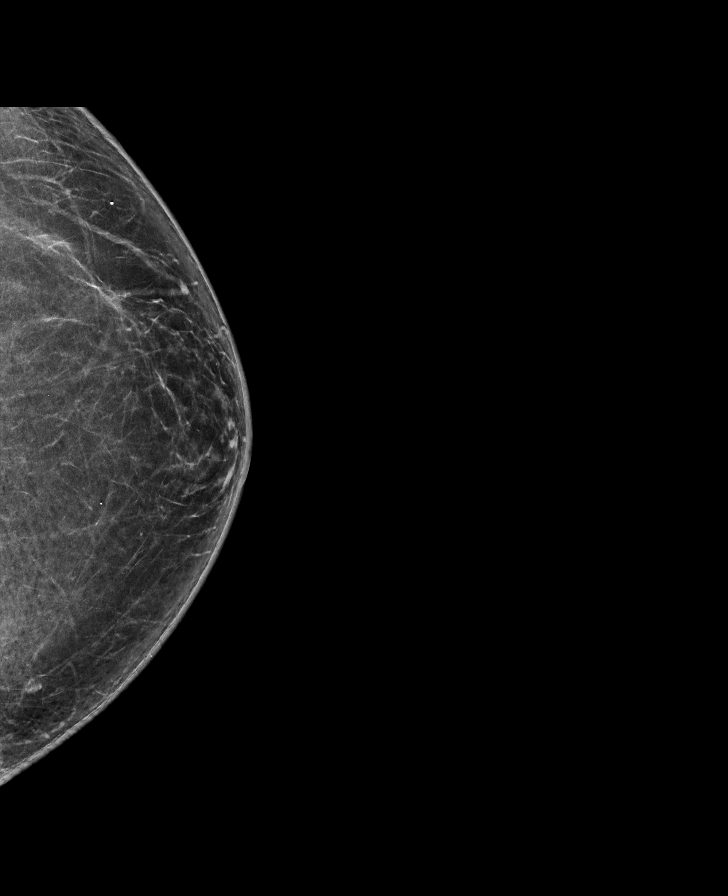

[8 of 30 positions shown; findings below may reference images not displayed]

ACR Breast Density Category b: There are scattered areas of
fibroglandular density.
FINDINGS: There are no findings suspicious for malignancy. Images were
processed with CAD.
IMPRESSION: No mammographic evidence of malignancy. A result letter of this
screening mammogram will be mailed directly to the patient.

RECOMMENDATION:
Screening mammogram in one year. (Code:97-6-RS4)

BI-RADS CATEGORY  1: Negative.

## 2017-10-26 DIAGNOSIS — J3489 Other specified disorders of nose and nasal sinuses: Secondary | ICD-10-CM | POA: Diagnosis not present

## 2017-10-26 DIAGNOSIS — J342 Deviated nasal septum: Secondary | ICD-10-CM | POA: Diagnosis not present

## 2017-10-26 DIAGNOSIS — R0981 Nasal congestion: Secondary | ICD-10-CM | POA: Diagnosis not present

## 2017-10-26 DIAGNOSIS — J343 Hypertrophy of nasal turbinates: Secondary | ICD-10-CM | POA: Diagnosis not present

## 2017-11-19 DIAGNOSIS — G629 Polyneuropathy, unspecified: Secondary | ICD-10-CM | POA: Diagnosis not present

## 2017-11-19 DIAGNOSIS — E785 Hyperlipidemia, unspecified: Secondary | ICD-10-CM | POA: Diagnosis not present

## 2017-11-19 DIAGNOSIS — E1140 Type 2 diabetes mellitus with diabetic neuropathy, unspecified: Secondary | ICD-10-CM | POA: Diagnosis not present

## 2017-11-19 DIAGNOSIS — Z794 Long term (current) use of insulin: Secondary | ICD-10-CM | POA: Diagnosis not present

## 2017-11-19 DIAGNOSIS — E1165 Type 2 diabetes mellitus with hyperglycemia: Secondary | ICD-10-CM | POA: Diagnosis not present

## 2018-02-07 DIAGNOSIS — J209 Acute bronchitis, unspecified: Secondary | ICD-10-CM | POA: Diagnosis not present

## 2018-02-18 DIAGNOSIS — Z6841 Body Mass Index (BMI) 40.0 and over, adult: Secondary | ICD-10-CM | POA: Diagnosis not present

## 2018-02-18 DIAGNOSIS — J22 Unspecified acute lower respiratory infection: Secondary | ICD-10-CM | POA: Diagnosis not present

## 2018-04-10 DIAGNOSIS — R05 Cough: Secondary | ICD-10-CM | POA: Diagnosis not present

## 2018-04-10 DIAGNOSIS — J069 Acute upper respiratory infection, unspecified: Secondary | ICD-10-CM | POA: Diagnosis not present

## 2018-04-27 ENCOUNTER — Other Ambulatory Visit: Payer: Self-pay | Admitting: Family Medicine

## 2018-04-27 DIAGNOSIS — Z1231 Encounter for screening mammogram for malignant neoplasm of breast: Secondary | ICD-10-CM

## 2018-06-09 DIAGNOSIS — M19011 Primary osteoarthritis, right shoulder: Secondary | ICD-10-CM | POA: Diagnosis not present

## 2018-06-09 DIAGNOSIS — Z6841 Body Mass Index (BMI) 40.0 and over, adult: Secondary | ICD-10-CM | POA: Diagnosis not present

## 2018-06-09 DIAGNOSIS — Z1339 Encounter for screening examination for other mental health and behavioral disorders: Secondary | ICD-10-CM | POA: Diagnosis not present

## 2018-06-29 DIAGNOSIS — H903 Sensorineural hearing loss, bilateral: Secondary | ICD-10-CM | POA: Diagnosis not present

## 2018-06-29 DIAGNOSIS — J342 Deviated nasal septum: Secondary | ICD-10-CM | POA: Diagnosis not present

## 2018-06-29 DIAGNOSIS — H9193 Unspecified hearing loss, bilateral: Secondary | ICD-10-CM | POA: Diagnosis not present

## 2018-06-29 DIAGNOSIS — H9313 Tinnitus, bilateral: Secondary | ICD-10-CM | POA: Diagnosis not present

## 2018-06-29 DIAGNOSIS — J343 Hypertrophy of nasal turbinates: Secondary | ICD-10-CM | POA: Diagnosis not present

## 2018-07-11 DIAGNOSIS — H66009 Acute suppurative otitis media without spontaneous rupture of ear drum, unspecified ear: Secondary | ICD-10-CM | POA: Diagnosis not present

## 2018-07-11 DIAGNOSIS — H6990 Unspecified Eustachian tube disorder, unspecified ear: Secondary | ICD-10-CM | POA: Diagnosis not present

## 2018-07-11 DIAGNOSIS — J309 Allergic rhinitis, unspecified: Secondary | ICD-10-CM | POA: Diagnosis not present

## 2018-07-11 DIAGNOSIS — J019 Acute sinusitis, unspecified: Secondary | ICD-10-CM | POA: Diagnosis not present

## 2018-08-03 DIAGNOSIS — E119 Type 2 diabetes mellitus without complications: Secondary | ICD-10-CM | POA: Diagnosis not present

## 2018-08-20 DIAGNOSIS — E1165 Type 2 diabetes mellitus with hyperglycemia: Secondary | ICD-10-CM | POA: Diagnosis not present

## 2018-08-20 DIAGNOSIS — E1140 Type 2 diabetes mellitus with diabetic neuropathy, unspecified: Secondary | ICD-10-CM | POA: Diagnosis not present

## 2018-08-20 DIAGNOSIS — E785 Hyperlipidemia, unspecified: Secondary | ICD-10-CM | POA: Diagnosis not present

## 2018-08-20 DIAGNOSIS — Z794 Long term (current) use of insulin: Secondary | ICD-10-CM | POA: Diagnosis not present

## 2018-08-20 DIAGNOSIS — G629 Polyneuropathy, unspecified: Secondary | ICD-10-CM | POA: Diagnosis not present

## 2018-09-20 ENCOUNTER — Ambulatory Visit
Admission: RE | Admit: 2018-09-20 | Discharge: 2018-09-20 | Disposition: A | Discharge status: Home / Self Care | Payer: BLUE CROSS/BLUE SHIELD | Source: Ambulatory Visit | Attending: Family Medicine | Admitting: Family Medicine

## 2018-09-20 ENCOUNTER — Encounter: Payer: Self-pay | Admitting: Radiology

## 2018-09-20 DIAGNOSIS — Z85820 Personal history of malignant melanoma of skin: Secondary | ICD-10-CM | POA: Diagnosis not present

## 2018-09-20 DIAGNOSIS — J069 Acute upper respiratory infection, unspecified: Secondary | ICD-10-CM | POA: Diagnosis not present

## 2018-09-20 DIAGNOSIS — Z1231 Encounter for screening mammogram for malignant neoplasm of breast: Secondary | ICD-10-CM | POA: Diagnosis not present

## 2018-09-20 DIAGNOSIS — R05 Cough: Secondary | ICD-10-CM | POA: Diagnosis not present

## 2018-09-20 DIAGNOSIS — D225 Melanocytic nevi of trunk: Secondary | ICD-10-CM | POA: Diagnosis not present

## 2018-09-21 DIAGNOSIS — J3489 Other specified disorders of nose and nasal sinuses: Secondary | ICD-10-CM | POA: Diagnosis not present

## 2018-09-21 DIAGNOSIS — Z6841 Body Mass Index (BMI) 40.0 and over, adult: Secondary | ICD-10-CM | POA: Diagnosis not present

## 2018-09-21 DIAGNOSIS — R0981 Nasal congestion: Secondary | ICD-10-CM | POA: Diagnosis not present

## 2018-09-21 DIAGNOSIS — Z124 Encounter for screening for malignant neoplasm of cervix: Secondary | ICD-10-CM | POA: Diagnosis not present

## 2018-09-21 DIAGNOSIS — J342 Deviated nasal septum: Secondary | ICD-10-CM | POA: Diagnosis not present

## 2018-09-21 DIAGNOSIS — J343 Hypertrophy of nasal turbinates: Secondary | ICD-10-CM | POA: Diagnosis not present

## 2018-09-21 DIAGNOSIS — Z0000 Encounter for general adult medical examination without abnormal findings: Secondary | ICD-10-CM | POA: Diagnosis not present

## 2018-10-24 HISTORY — PX: NASAL SINUS SURGERY: SHX719

## 2018-10-26 DIAGNOSIS — J342 Deviated nasal septum: Secondary | ICD-10-CM | POA: Diagnosis not present

## 2018-10-26 DIAGNOSIS — J3489 Other specified disorders of nose and nasal sinuses: Secondary | ICD-10-CM | POA: Diagnosis not present

## 2018-10-26 DIAGNOSIS — J343 Hypertrophy of nasal turbinates: Secondary | ICD-10-CM | POA: Diagnosis not present

## 2018-10-26 DIAGNOSIS — R0981 Nasal congestion: Secondary | ICD-10-CM | POA: Diagnosis not present

## 2018-11-10 DIAGNOSIS — J0121 Acute recurrent ethmoidal sinusitis: Secondary | ICD-10-CM | POA: Diagnosis not present

## 2018-11-10 DIAGNOSIS — J3489 Other specified disorders of nose and nasal sinuses: Secondary | ICD-10-CM | POA: Diagnosis not present

## 2018-11-10 DIAGNOSIS — J329 Chronic sinusitis, unspecified: Secondary | ICD-10-CM | POA: Diagnosis not present

## 2018-11-10 DIAGNOSIS — Z7982 Long term (current) use of aspirin: Secondary | ICD-10-CM | POA: Diagnosis not present

## 2018-11-10 DIAGNOSIS — J322 Chronic ethmoidal sinusitis: Secondary | ICD-10-CM | POA: Diagnosis not present

## 2018-11-10 DIAGNOSIS — J343 Hypertrophy of nasal turbinates: Secondary | ICD-10-CM | POA: Diagnosis not present

## 2018-11-10 DIAGNOSIS — E785 Hyperlipidemia, unspecified: Secondary | ICD-10-CM | POA: Diagnosis not present

## 2018-11-10 DIAGNOSIS — J320 Chronic maxillary sinusitis: Secondary | ICD-10-CM | POA: Diagnosis not present

## 2018-11-10 DIAGNOSIS — R0981 Nasal congestion: Secondary | ICD-10-CM | POA: Diagnosis not present

## 2018-11-10 DIAGNOSIS — J342 Deviated nasal septum: Secondary | ICD-10-CM | POA: Diagnosis not present

## 2018-11-10 DIAGNOSIS — J0101 Acute recurrent maxillary sinusitis: Secondary | ICD-10-CM | POA: Diagnosis not present

## 2018-11-10 DIAGNOSIS — Z794 Long term (current) use of insulin: Secondary | ICD-10-CM | POA: Diagnosis not present

## 2018-11-10 DIAGNOSIS — J0111 Acute recurrent frontal sinusitis: Secondary | ICD-10-CM | POA: Diagnosis not present

## 2018-11-10 DIAGNOSIS — I10 Essential (primary) hypertension: Secondary | ICD-10-CM | POA: Diagnosis not present

## 2018-11-10 DIAGNOSIS — Z6841 Body Mass Index (BMI) 40.0 and over, adult: Secondary | ICD-10-CM | POA: Diagnosis not present

## 2018-11-10 DIAGNOSIS — E119 Type 2 diabetes mellitus without complications: Secondary | ICD-10-CM | POA: Diagnosis not present

## 2018-11-10 DIAGNOSIS — J321 Chronic frontal sinusitis: Secondary | ICD-10-CM | POA: Diagnosis not present

## 2018-11-14 DIAGNOSIS — Z4800 Encounter for change or removal of nonsurgical wound dressing: Secondary | ICD-10-CM | POA: Diagnosis not present

## 2018-11-14 DIAGNOSIS — R040 Epistaxis: Secondary | ICD-10-CM | POA: Diagnosis not present

## 2018-11-16 DIAGNOSIS — J329 Chronic sinusitis, unspecified: Secondary | ICD-10-CM | POA: Diagnosis not present

## 2018-11-22 DIAGNOSIS — J329 Chronic sinusitis, unspecified: Secondary | ICD-10-CM | POA: Diagnosis not present

## 2018-11-30 DIAGNOSIS — J329 Chronic sinusitis, unspecified: Secondary | ICD-10-CM | POA: Diagnosis not present

## 2018-12-02 DIAGNOSIS — E785 Hyperlipidemia, unspecified: Secondary | ICD-10-CM | POA: Diagnosis not present

## 2018-12-02 DIAGNOSIS — E1140 Type 2 diabetes mellitus with diabetic neuropathy, unspecified: Secondary | ICD-10-CM | POA: Diagnosis not present

## 2018-12-02 DIAGNOSIS — Z794 Long term (current) use of insulin: Secondary | ICD-10-CM | POA: Diagnosis not present

## 2018-12-02 DIAGNOSIS — E1165 Type 2 diabetes mellitus with hyperglycemia: Secondary | ICD-10-CM | POA: Diagnosis not present

## 2018-12-02 DIAGNOSIS — G629 Polyneuropathy, unspecified: Secondary | ICD-10-CM | POA: Diagnosis not present

## 2018-12-12 DIAGNOSIS — I10 Essential (primary) hypertension: Secondary | ICD-10-CM | POA: Diagnosis not present

## 2018-12-12 DIAGNOSIS — E119 Type 2 diabetes mellitus without complications: Secondary | ICD-10-CM | POA: Diagnosis not present

## 2018-12-12 DIAGNOSIS — R0602 Shortness of breath: Secondary | ICD-10-CM | POA: Diagnosis not present

## 2018-12-12 DIAGNOSIS — E785 Hyperlipidemia, unspecified: Secondary | ICD-10-CM | POA: Diagnosis not present

## 2018-12-28 DIAGNOSIS — J310 Chronic rhinitis: Secondary | ICD-10-CM | POA: Diagnosis not present

## 2019-01-06 ENCOUNTER — Other Ambulatory Visit: Payer: Self-pay | Admitting: Family Medicine

## 2019-01-06 DIAGNOSIS — Z1231 Encounter for screening mammogram for malignant neoplasm of breast: Secondary | ICD-10-CM

## 2019-01-20 ENCOUNTER — Encounter: Payer: Self-pay | Admitting: Cardiology

## 2019-01-20 ENCOUNTER — Ambulatory Visit: Payer: BLUE CROSS/BLUE SHIELD | Admitting: Cardiology

## 2019-01-20 VITALS — BP 110/60 | HR 84 | Ht 66.0 in | Wt 272.6 lb

## 2019-01-20 DIAGNOSIS — E7800 Pure hypercholesterolemia, unspecified: Secondary | ICD-10-CM | POA: Diagnosis not present

## 2019-01-20 DIAGNOSIS — E663 Overweight: Secondary | ICD-10-CM

## 2019-01-20 DIAGNOSIS — R0609 Other forms of dyspnea: Secondary | ICD-10-CM

## 2019-01-20 DIAGNOSIS — R0600 Dyspnea, unspecified: Secondary | ICD-10-CM | POA: Insufficient documentation

## 2019-01-20 DIAGNOSIS — E119 Type 2 diabetes mellitus without complications: Secondary | ICD-10-CM

## 2019-01-20 DIAGNOSIS — Z794 Long term (current) use of insulin: Secondary | ICD-10-CM

## 2019-01-20 HISTORY — DX: Other forms of dyspnea: R06.09

## 2019-01-20 NOTE — Progress Notes (Signed)
Cardiology Consultation:    Date:  01/20/2019   ID:  Rachael Sanford, DOB November 07, 1966, MRN [id number]  PCP:  Street, Sharon Mt, MD  Cardiologist:  Jenne Campus, MD   Referring MD: Street, Sharon Mt, *   Chief Complaint  Patient presents with  . Shortness of Breath  I am short of breath  History of Present Illness:    Rachael Sanford is a 53 y.o. female who is being seen today for the evaluation of shortness of breath at the request of Street, Sharon Mt, *.  She does have past medical history significant for longstanding diabetes, morbid obesity, polycystic ovarian syndrome.  She was referred to me because of exertional shortness of breath she recently moved to a new location and then she was trying to walk to her neighbor on the way back it was uphill she became severely short of breath then the same day she try to walk to different number in same scenario happened.  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  She never had any cardiac work-up done.  She does have diabetes required a lot of insulin for it.  She is on cholesterol medication she is on aspirin already.  Does not smoke drink very rarely socially.  Does have family history of coronary artery disease and I am actually taking care of multiple family members.  Past Medical History:  Diagnosis Date  . Cancer ([place]) 2009   FACIAL MELANOMA  . Diabetes mellitus    TYPE II  . High cholesterol   . Hypertension   . Seizure ([place])    AS AN INFANT    Past Surgical History:  Procedure Laterality Date  . melanoma removal  2009   from face   . NASAL SINUS SURGERY    . TRIGGER FINGER RELEASE      Current Medications: Current Meds  Medication Sig  . aspirin 81 MG tablet Take 81 mg by mouth daily.  Marland Kitchen b complex  vitamins capsule Take 1 capsule by mouth daily.  . Biotin 5000 MCG CAPS Take by mouth.  . Calcium Carbonate-Vit D-Min (CALTRATE 600+D PLUS PO) Take by mouth.  . ezetimibe (ZETIA) 10 MG tablet Take 10 mg by mouth daily.  . insulin aspart (NOVOLOG) 100 UNIT/ML injection Inject 35 Units into the skin 3 (three) times daily before meals.  . insulin glargine (LANTUS) 100 UNIT/ML injection Inject into the skin 2 (two) times daily. 75 units in the am and 65 units in the pm  . levonorgestrel-ethinyl estradiol (AVIANE,ALESSE,LESSINA) 0.1-20 MG-MCG tablet Take 1 tablet by mouth daily.  Marland Kitchen losartan (COZAAR) 100 MG tablet Take 100 mg by mouth daily.  . metFORMIN (GLUCOPHAGE) 500 MG tablet Take 500 mg by mouth 2 (two) times daily with a meal.  . pravastatin (PRAVACHOL) 40 MG tablet Take 40 mg by mouth daily.     Allergies:   Seldane [terfenadine] and Septra [bactrim]   Social History   Socioeconomic History  . Marital status: Single    Spouse name: Not on file  . Number of children: Not on file  . Years of education: Not on file  . Highest education level: Not on file  Occupational History  . Not on file  Social Needs  . Financial resource strain: Not on  file  . Food insecurity:    Worry: Not on file    Inability: Not on file  . Transportation needs:    Medical: Not on file    Non-medical: Not on file  Tobacco Use  . Smoking status: Never Smoker  . Smokeless tobacco: Never Used  Substance and Sexual Activity  . Alcohol use: Yes    Comment: seldom  . Drug use: No  . Sexual activity: Yes    Birth control/protection: Pill  Lifestyle  . Physical activity:    Days per week: Not on file    Minutes per session: Not on file  . Stress: Not on file  Relationships  . Social connections:    Talks on phone: Not on file    Gets together: Not on file    Attends religious service: Not on file    Active member of club or organization: Not on file    Attends meetings of clubs or organizations: Not  on file    Relationship status: Not on file  Other Topics Concern  . Not on file  Social History Narrative  . Not on file     Family History: The patient's family history includes Breast cancer in her mother; Cancer in her maternal uncle, mother, paternal aunt, and paternal grandfather; Diabetes in her father and paternal grandmother; Heart disease in her father and paternal grandmother; Hyperlipidemia in her father; Hypertension in her maternal aunt; Macular degeneration in her father. ROS:   Please see the history of present illness.    All 14 point review of systems negative except as described per history of present illness.  EKGs/Labs/Other Studies Reviewed:    The following studies were reviewed today:  EKG done by primary care physician on the January 19 showed normal sinus rhythm normal P interval left axis deviation PVCs nonspecific ST segment changes poor R wave progression anterior precordium  Recent Labs: No results found for requested labs within last 8760 hours.  Recent Lipid Panel No results found for: CHOL, TRIG, HDL, CHOLHDL, VLDL, LDLCALC, LDLDIRECT  Physical Exam:    VS:  BP 110/60   Pulse 84   Ht 5\' 6"  (1.676 m)   Wt 272 lb 9.6 oz (123.7 kg)   LMP 08/31/2017   SpO2 96%   BMI 44.00 kg/m     Wt Readings from Last 3 Encounters:  01/20/19 272 lb 9.6 oz (123.7 kg)  02/24/12 288 lb (130.6 kg)     GEN:  Well nourished, well developed in no acute distress HEENT: Normal NECK: No JVD; No carotid bruits LYMPHATICS: No lymphadenopathy CARDIAC: RRR, no murmurs, no rubs, no gallops RESPIRATORY:  Clear to auscultation without rales, wheezing or rhonchi  ABDOMEN: Soft, non-tender, non-distended MUSCULOSKELETAL:  No edema; No deformity  SKIN: Warm and dry NEUROLOGIC:  Alert and oriented x 3 PSYCHIATRIC:  Normal affect   ASSESSMENT:    1. Type 2 diabetes mellitus without complication, with long-term current use of insulin ([place])   2. Dyspnea on exertion   3.  High cholesterol   4. Overweight    PLAN:    In order of problems listed above:  1. Dyspnea on exertion.  Probably multifactorial obviously because of her significant risk factors for coronary artery disease we need to rule out that as potential explanation for his symptoms therefore, I will ask you to have echocardiogram to assess left ventricular ejection fraction.  She also will be scheduled to have exercise Cardiolite to see her exercise capacity as  well as to look for any evidence of significant ischemia.  She is already on aspirin which I will continue she is already on statin which I will continue. 2. Type 2 diabetes followed by internal medicine team. 3. Dyslipidemia her last LDL was 80 ideally need to be closer to 70.  However decision in terms of aggressiveness of therapy will be made based on results of her test. 4. Obesity no question that this is significant problem will be very beneficial for her to lose some weight she tried all different diets with obviously limited or no success.  This is discussed in that regard continue in the future. 5. Healthy lifestyle we discussed about exercises on a regular basis she does work in [employer] that is physical work but still it will be beneficial for her to exercise.   Medication Adjustments/Labs and Tests Ordered: Current medicines are reviewed at length with the patient today.  Concerns regarding medicines are outlined above.  No orders of the defined types were placed in this encounter.  No orders of the defined types were placed in this encounter.   Signed, Park Liter, MD, [facility]. 01/20/2019 4:25 PM    [facility]

## 2019-01-20 NOTE — Patient Instructions (Signed)
Medication Instructions:  Your physician recommends that you continue on your current medications as directed. Please refer to the Current Medication list given to you today.   If you need a refill on your cardiac medications before your next appointment, please call your pharmacy.   Lab work: NONE   Testing/Procedures: Your physician has requested that you have an echocardiogram. Echocardiography is a painless test that uses sound waves to create images of your heart. It provides your doctor with information about the size and shape of your heart and how well your heart's chambers and valves are working. This procedure takes approximately one hour. There are no restrictions for this procedure.  Your physician has requested that you have a stress echocardiogram. For further information please visit [email or web address]. Please follow instruction sheet as given.   Follow-Up: At [facility], you and your health needs are our priority.  As part of our continuing mission to provide you with exceptional heart care, we have created designated Provider Care Teams.  These Care Teams include your primary Cardiologist (physician) and Advanced Practice Providers (APPs -  Physician Assistants and Nurse Practitioners) who all work together to provide you with the care you need, when you need it. You will need a follow up appointment in 1 months.     Any Other Special Instructions Will Be Listed Below   Echocardiogram An echocardiogram is a procedure that uses painless sound waves (ultrasound) to produce an image of the heart. Images from an echocardiogram can provide important information about:  Signs of coronary artery disease (CAD).  Aneurysm detection. An aneurysm is a weak or damaged part of an artery wall that bulges out from the normal force of blood pumping through the body.  Heart size and shape. Changes in the size or shape of the heart can be associated with certain conditions,  including heart failure, aneurysm, and CAD.  Heart muscle function.  Heart valve function.  Signs of a past heart attack.  Fluid buildup around the heart.  Thickening of the heart muscle.  A tumor or infectious growth around the heart valves. Tell a health care provider about:  Any allergies you have.  All medicines you are taking, including vitamins, herbs, eye drops, creams, and over-the-counter medicines.  Any blood disorders you have.  Any surgeries you have had.  Any medical conditions you have.  Whether you are pregnant or may be pregnant. What are the risks? Generally, this is a safe procedure. However, problems may occur, including:  Allergic reaction to dye (contrast) that may be used during the procedure. What happens before the procedure? No specific preparation is needed. You may eat and drink normally. What happens during the procedure?   An IV tube may be inserted into one of your veins.  You may receive contrast through this tube. A contrast is an injection that improves the quality of the pictures from your heart.  A gel will be applied to your chest.  A wand-like tool (transducer) will be moved over your chest. The gel will help to transmit the sound waves from the transducer.  The sound waves will harmlessly bounce off of your heart to allow the heart images to be captured in real-time motion. The images will be recorded on a computer. The procedure may vary among health care providers and hospitals. What happens after the procedure?  You may return to your normal, everyday life, including diet, activities, and medicines, unless your health care provider tells you not to do that.  Summary  An echocardiogram is a procedure that uses painless sound waves (ultrasound) to produce an image of the heart.  Images from an echocardiogram can provide important information about the size and shape of your heart, heart muscle function, heart valve function,  and fluid buildup around your heart.  You do not need to do anything to prepare before this procedure. You may eat and drink normally.  After the echocardiogram is completed, you may return to your normal, everyday life, unless your health care provider tells you not to do that. This information is not intended to replace advice given to you by your health care provider. Make sure you discuss any questions you have with your health care provider. Document Released: 11/07/2000 Document Revised: 12/13/2016 Document Reviewed: 12/13/2016 Elsevier Interactive Patient Education  2019 [employer].    Exercise Stress Echocardiogram  An exercise stress echocardiogram is a test that checks how well your heart is working. For this test, you will walk on a treadmill to make your heart beat faster. This test uses sound waves (ultrasound) and a computer to make pictures (images) of your heart. These pictures will be taken before you exercise and after you exercise. What happens before the procedure?  Follow instructions from your doctor about what you cannot eat or drink before the test.  Do not drink or eat anything that has caffeine in it. Stop having caffeine for 24 hours before the test.  Ask your doctor about changing or stopping your normal medicines. This is important if you take diabetes medicines or blood thinners. Ask your doctor if you should take your medicines with water before the test.  If you use an inhaler, bring it to the test.  Do not use any products that have nicotine or tobacco in them, such as cigarettes and e-cigarettes. Stop using them for 4 hours before the test. If you need help quitting, ask your doctor.  Wear comfortable shoes and clothing. What happens during the procedure?  You will be hooked up to a TV screen. Your doctor will watch the screen to see how fast your heart beats during the test.  Before you exercise, a computer will make a picture of your heart. To do  this: ? A gel will be put on your chest. ? A wand will be moved over the gel. ? Sound waves from the wand will go to the computer to make the picture.  Your will start walking on a treadmill. The treadmill will start at a slow speed. It will get faster a little bit at a time. When you walk faster, your heart will beat faster.  The treadmill will be stopped when your heart is working hard.  You will lie down right away so another picture of your heart can be taken.  The test will take 30-60 minutes. What happens after the procedure?  Your heart rate and blood pressure will be watched after the test.  If your doctor says that you can, you may: ? Eat what you usually eat. ? Do your normal activities. ? Take medicines like normal. Summary  An exercise stress echocardiogram is a test that checks how well your heart is working.  Follow instructions about what you cannot eat or drink before the test. Ask your doctor if you should take your normal medicines before the test.  Stop having caffeine for 24 hours before the test. Do not use anything with nicotine or tobacco in it for 4 hours before the test.  A computer will take a picture of your heart before you walk on a treadmill. It will take another picture when you are done walking.  Your heart rate and blood pressure will be watched after the test. This information is not intended to replace advice given to you by your health care provider. Make sure you discuss any questions you have with your health care provider. Document Released: 09/07/2009 Document Revised: 08/03/2016 Document Reviewed: 08/03/2016 Elsevier Interactive Patient Education  2019 [employer].

## 2019-02-25 ENCOUNTER — Other Ambulatory Visit: Payer: BLUE CROSS/BLUE SHIELD

## 2019-02-25 ENCOUNTER — Telehealth: Payer: Self-pay | Admitting: Emergency Medicine

## 2019-02-25 NOTE — Telephone Encounter (Signed)
Spoke with patient and she would like to postpone appointment for next week. She is working overtime right now and doesn't have time. She denies any urgent concerns for Dr. Agustin Cree at this time. Will send her to the pool and/or she will call when she is able to reschedule.      Primary Cardiologist:  No primary care provider on file.   Patient contacted.  History reviewed.  No symptoms to suggest any unstable cardiac conditions.  Based on discussion, with current pandemic situation, we will be postponing this appointment for Rachael Sanford with a plan for f/u in 4-6 wks or sooner if feasible/necessary.  If symptoms change, she has been instructed to contact our office.   Routing to C19 CANCEL pool for tracking (P CV DIV CV19 CANCEL - reason for visit "other.") and assigning priority (1 = 4-6 wks, 2 = 6-12 wks, 3 = >12 wks).   Ashok Norris, RN  02/25/2019 11:00 AM         .

## 2019-03-01 DIAGNOSIS — Z9889 Other specified postprocedural states: Secondary | ICD-10-CM | POA: Diagnosis not present

## 2019-03-01 DIAGNOSIS — J310 Chronic rhinitis: Secondary | ICD-10-CM | POA: Diagnosis not present

## 2019-03-01 DIAGNOSIS — J329 Chronic sinusitis, unspecified: Secondary | ICD-10-CM | POA: Diagnosis not present

## 2019-03-02 ENCOUNTER — Telehealth: Payer: BLUE CROSS/BLUE SHIELD | Admitting: Cardiology

## 2019-03-30 DIAGNOSIS — J069 Acute upper respiratory infection, unspecified: Secondary | ICD-10-CM | POA: Diagnosis not present

## 2019-03-30 DIAGNOSIS — R51 Headache: Secondary | ICD-10-CM | POA: Diagnosis not present

## 2019-03-30 DIAGNOSIS — A78 Q fever: Secondary | ICD-10-CM | POA: Diagnosis not present

## 2019-03-30 DIAGNOSIS — R05 Cough: Secondary | ICD-10-CM | POA: Diagnosis not present

## 2019-04-01 DIAGNOSIS — I10 Essential (primary) hypertension: Secondary | ICD-10-CM | POA: Diagnosis not present

## 2019-04-01 DIAGNOSIS — E785 Hyperlipidemia, unspecified: Secondary | ICD-10-CM | POA: Diagnosis not present

## 2019-04-01 DIAGNOSIS — G629 Polyneuropathy, unspecified: Secondary | ICD-10-CM | POA: Diagnosis not present

## 2019-04-01 DIAGNOSIS — E1149 Type 2 diabetes mellitus with other diabetic neurological complication: Secondary | ICD-10-CM | POA: Diagnosis not present

## 2019-05-21 DIAGNOSIS — S39012A Strain of muscle, fascia and tendon of lower back, initial encounter: Secondary | ICD-10-CM | POA: Diagnosis not present

## 2019-06-03 DIAGNOSIS — G629 Polyneuropathy, unspecified: Secondary | ICD-10-CM | POA: Diagnosis not present

## 2019-06-03 DIAGNOSIS — E785 Hyperlipidemia, unspecified: Secondary | ICD-10-CM | POA: Diagnosis not present

## 2019-06-03 DIAGNOSIS — E1149 Type 2 diabetes mellitus with other diabetic neurological complication: Secondary | ICD-10-CM | POA: Diagnosis not present

## 2019-08-22 DIAGNOSIS — I10 Essential (primary) hypertension: Secondary | ICD-10-CM | POA: Diagnosis not present

## 2019-08-22 DIAGNOSIS — G629 Polyneuropathy, unspecified: Secondary | ICD-10-CM | POA: Diagnosis not present

## 2019-08-22 DIAGNOSIS — E1149 Type 2 diabetes mellitus with other diabetic neurological complication: Secondary | ICD-10-CM | POA: Diagnosis not present

## 2019-08-22 DIAGNOSIS — E785 Hyperlipidemia, unspecified: Secondary | ICD-10-CM | POA: Diagnosis not present

## 2019-09-22 DIAGNOSIS — D485 Neoplasm of uncertain behavior of skin: Secondary | ICD-10-CM | POA: Diagnosis not present

## 2019-09-22 DIAGNOSIS — L821 Other seborrheic keratosis: Secondary | ICD-10-CM | POA: Diagnosis not present

## 2019-09-22 DIAGNOSIS — L918 Other hypertrophic disorders of the skin: Secondary | ICD-10-CM | POA: Diagnosis not present

## 2019-09-22 DIAGNOSIS — Z85820 Personal history of malignant melanoma of skin: Secondary | ICD-10-CM | POA: Diagnosis not present

## 2019-09-22 DIAGNOSIS — D1801 Hemangioma of skin and subcutaneous tissue: Secondary | ICD-10-CM | POA: Diagnosis not present

## 2019-09-23 ENCOUNTER — Ambulatory Visit
Admission: RE | Admit: 2019-09-23 | Discharge: 2019-09-23 | Disposition: A | Discharge status: Home / Self Care | Payer: BC Managed Care – PPO | Source: Ambulatory Visit | Attending: Family Medicine | Admitting: Family Medicine

## 2019-09-23 ENCOUNTER — Other Ambulatory Visit: Payer: Self-pay

## 2019-09-23 DIAGNOSIS — Z0000 Encounter for general adult medical examination without abnormal findings: Secondary | ICD-10-CM | POA: Diagnosis not present

## 2019-09-23 DIAGNOSIS — Z6841 Body Mass Index (BMI) 40.0 and over, adult: Secondary | ICD-10-CM | POA: Diagnosis not present

## 2019-09-23 DIAGNOSIS — Z1231 Encounter for screening mammogram for malignant neoplasm of breast: Secondary | ICD-10-CM

## 2019-09-23 DIAGNOSIS — Z1331 Encounter for screening for depression: Secondary | ICD-10-CM | POA: Diagnosis not present

## 2019-10-21 DIAGNOSIS — R0981 Nasal congestion: Secondary | ICD-10-CM | POA: Diagnosis not present

## 2019-10-21 DIAGNOSIS — J209 Acute bronchitis, unspecified: Secondary | ICD-10-CM | POA: Diagnosis not present

## 2019-10-21 DIAGNOSIS — R05 Cough: Secondary | ICD-10-CM | POA: Diagnosis not present

## 2019-10-24 ENCOUNTER — Encounter: Payer: Self-pay | Admitting: Allergy and Immunology

## 2019-10-24 ENCOUNTER — Ambulatory Visit: Payer: BC Managed Care – PPO | Admitting: Allergy and Immunology

## 2019-10-24 ENCOUNTER — Other Ambulatory Visit: Payer: Self-pay

## 2019-10-24 VITALS — BP 130/56 | HR 84 | Temp 97.2°F | Resp 20 | Ht 65.0 in | Wt 267.0 lb

## 2019-10-24 DIAGNOSIS — I10 Essential (primary) hypertension: Secondary | ICD-10-CM

## 2019-10-24 DIAGNOSIS — K219 Gastro-esophageal reflux disease without esophagitis: Secondary | ICD-10-CM | POA: Diagnosis not present

## 2019-10-24 DIAGNOSIS — J3089 Other allergic rhinitis: Secondary | ICD-10-CM

## 2019-10-24 DIAGNOSIS — B999 Unspecified infectious disease: Secondary | ICD-10-CM

## 2019-10-24 DIAGNOSIS — J324 Chronic pansinusitis: Secondary | ICD-10-CM

## 2019-10-24 MED ORDER — AMOXICILLIN-POT CLAVULANATE 875-125 MG PO TABS: 1.0000 | ORAL_TABLET | Freq: Two times a day (BID) | ORAL | 0 refills | Status: DC

## 2019-10-24 MED ORDER — MONTELUKAST SODIUM 10 MG PO TABS: 10.0000 mg | ORAL_TABLET | Freq: Every day | ORAL | 5 refills | Status: DC

## 2019-10-24 NOTE — Progress Notes (Signed)
[facility] [place]   NEW PATIENT NOTE  Referring Provider: No ref. provider found Primary Provider: Street, Sharon Mt, MD Date of office visit: 10/24/2019    Subjective:   Chief Complaint:  Rachael Sanford (DOB: July 28, 1966) is a 53 y.o. female who presents to the clinic on 10/24/2019 with a chief complaint of Nasal Congestion .     HPI: [name] presents to this clinic in evaluation of persistent upper airway symptoms.  Most recently, over the course of the past few months, she has had nasal congestion and pressure in her head and sneezing and some occasional itchy eyes without any fever or ugly nasal discharge or anosmia for which she was recently placed on Augmentin over the course of the past 4 days.  She has tried multiple over-the-counter medications in the treatment of this issue which were not working.  She uses nasal saline but and does not use any over-the-counter nasal decongestant spray.  Apparently she has a prolonged history of upper airway symptoms that has required sinus surgery in December 2019 including repair of deviated septum and what sounds like turbinate reduction and FESS.  This was performed by Dr. Gaylyn Cheers based upon the results of a sinus CT scan obtained several years prior to the surgery.  She does have a history of dyspnea on exertion which has been apparently evaluated by cardiology but at this point time Jessicaanne does not really believe that she has much problems with her lower airways or shortness of breath on exertion.  She also does not have a history of food hypersensitivity or issues with eczema.  She does not really note any obvious provoking factor giving rise to the symptoms.  She does have some occasional heartburn and a very rare regurgitation for which he uses Tums just a few times per year.  She does have a sensation of throat clearing and postnasal drip.  She does drink 3 diet Cokes or Dr. Samson Frederic per day.  She does  not receive the flu vaccine and it does not appear as though she will be obtaining the Covid vaccine.  Past Medical History:  Diagnosis Date  . Cancer ([place]) 2009   FACIAL MELANOMA  . Diabetes mellitus    TYPE II  . High cholesterol   . Hypertension   . Seizure ([place])    AS AN INFANT    Past Surgical History:  Procedure Laterality Date  . BREAST CYST EXCISION Left   . melanoma removal  2009   from face   . MOLE REMOVAL    . NASAL SINUS SURGERY  10/2018  . TRIGGER FINGER RELEASE      Allergies as of 10/24/2019      Reactions   Seldane [terfenadine] Rash   Septra [bactrim] Rash      Medication List    ACETAMINOPHEN PO Take by mouth.   ALLEGRA PO Take by mouth.   amoxicillin-clavulanate 875-125 MG tablet Commonly known as: AUGMENTIN Take 1 tablet by mouth 2 (two) times daily.   aspirin 81 MG tablet Take 81 mg by mouth daily.   b complex vitamins capsule Take 1 capsule by mouth daily.   BASAGLAR KWIKPEN [place] Inject into the skin 2 (two) times daily. 70 units in morning, 65 units in the evening   Biotin 10000 MCG Tabs Take by mouth.    CALCIUM-MAGNESIUM-ZINC PO Take by mouth.   CENTRUM SILVER 50+WOMEN PO Take by mouth.   CINNAMON PO Take by  mouth. 1,000mg  to 2,000mg    ezetimibe 10 MG tablet Commonly known as: ZETIA Take 10 mg by mouth daily.   levonorgestrel-ethinyl estradiol 0.1-20 MG-MCG tablet Commonly known as: ALESSE Take 1 tablet by mouth daily.   losartan 100 MG tablet Commonly known as: COZAAR Take 100 mg by mouth daily.   MELATONIN PO Take by mouth.   METAMUCIL PO Take by mouth.   metFORMIN 500 MG tablet Commonly known as: GLUCOPHAGE Take 500 mg by mouth 2 (two) times daily with a meal.   NovoLOG 100 UNIT/ML injection Generic drug: insulin aspart Inject 35 Units into the skin 3 (three) times daily before meals.   pravastatin 40 MG tablet Commonly known as: PRAVACHOL Take 40 mg by mouth daily.   SALINE NASAL SPRAY NA Place  into the nose.       Review of systems negative except as noted in HPI / PMHx or noted below:  Review of Systems  Constitutional: Negative.   HENT: Negative.   Eyes: Negative.   Respiratory: Negative.   Cardiovascular: Negative.   Gastrointestinal: Negative.   Genitourinary: Negative.   Musculoskeletal: Negative.   Skin: Negative.   Neurological: Negative.   Endo/Heme/Allergies: Negative.   Psychiatric/Behavioral: Negative.     Family History  Problem Relation Age of Onset  . Breast cancer Mother   . Cancer Mother        LIVER AND GALLBLADDER  . Diabetes Father   . Heart disease Father   . Macular degeneration Father   . Hyperlipidemia Father   . Hypertension Maternal Aunt   . Cancer Paternal Aunt        OVARIAN  . Macular degeneration Paternal Aunt   . Diabetes Paternal Grandmother   . Heart disease Paternal Grandmother   . Cancer Paternal Grandfather        COLON  . Cancer Maternal Uncle        liver and pancreas    Social History   Socioeconomic History  . Marital status: Single    Spouse name: Not on file  . Number of children: Not on file  . Years of education: Not on file  . Highest education level: Not on file  Occupational History  . Not on file  Social Needs  . Financial resource strain: Not on file  . Food insecurity    Worry: Not on file    Inability: Not on file  . Transportation needs    Medical: Not on file    Non-medical: Not on file  Tobacco Use  . Smoking status: Never Smoker  . Smokeless tobacco: Never Used  Substance and Sexual Activity  . Alcohol use: Yes    Comment: seldom  . Drug use: No  . Sexual activity: Yes    Birth control/protection: Pill  Lifestyle  . Physical activity    Days per week: Not on file    Minutes per session: Not on file  . Stress: Not on file  Relationships  . Social Herbalist on phone: Not on file    Gets together: Not on file    Attends religious service: Not on file    Active  member of club or organization: Not on file    Attends meetings of clubs or organizations: Not on file    Relationship status: Not on file  . Intimate partner violence    Fear of current or ex partner: Not on file    Emotionally abused: Not on file  Physically abused: Not on file    Forced sexual activity: Not on file  Other Topics Concern  . Not on file  Social History Narrative  . Not on file    Environmental and Social history  Lives in a house with a dry environment, no animals located inside the household, carpet in the bedroom, no plastic on the bed, no plastic on the pillow, no smoking ongoing with inside the household.  She is a Glass blower/designer.  Objective:   Vitals:   10/24/19 0933 10/24/19 1101  BP: (!) 154/60 (!) 130/56  Pulse: 84   Resp: 20   Temp: (!) 97.2 F (36.2 C)   SpO2: 95%    Height: 5\' 5"  (165.1 cm) Weight: 267 lb (121.1 kg)  Physical Exam Constitutional:      Appearance: She is not diaphoretic.  HENT:     Head: Normocephalic. No right periorbital erythema or left periorbital erythema.     Right Ear: Tympanic membrane, ear canal and external ear normal.     Left Ear: Tympanic membrane, ear canal and external ear normal.     Nose: Nose normal. No mucosal edema or rhinorrhea.     Mouth/Throat:     Pharynx: No oropharyngeal exudate.  Eyes:     General: Lids are normal.     Conjunctiva/sclera: Conjunctivae normal.     Pupils: Pupils are equal, round, and reactive to light.  Neck:     Thyroid: No thyromegaly.     Trachea: Trachea normal. No tracheal deviation.  Cardiovascular:     Rate and Rhythm: Normal rate and regular rhythm.     Heart sounds: Normal heart sounds, S1 normal and S2 normal. No murmur.  Pulmonary:     Effort: Pulmonary effort is normal. No respiratory distress.     Breath sounds: No stridor. No wheezing or rales.  Chest:     Chest wall: No tenderness.  Abdominal:     General: There is no distension.     Palpations: Abdomen  is soft. There is no mass.     Tenderness: There is no abdominal tenderness. There is no guarding or rebound.  Musculoskeletal:        General: No tenderness.  Lymphadenopathy:     Head:     Right side of head: No tonsillar adenopathy.     Left side of head: No tonsillar adenopathy.     Cervical: No cervical adenopathy.  Skin:    Coloration: Skin is not pale.     Findings: No erythema or rash.     Nails: There is no clubbing.   Neurological:     Mental Status: She is alert.     Diagnostics: Allergy skin tests were performed.  She did not demonstrate any hypersensitivity against a screening panel of aeroallergens or foods.  Results of a sinus CT scan dated 16 June 2016 refers to slight mucosal thickening in the floors of the maxillary sinuses bilaterally.  Remainder of paranasal sinuses are clear.  No air-fluid levels.  Mastoid air cells are clear.  No acute bony abnormality.  Orbital soft tissues unremarkable.  Results of an operative report dated 10 November 2018 refers to nasal septoplasty, repair of nasal valve stenosis, submucosal resection of inferior turbinates bilaterally, endoscopic sinus surgery with bilateral nasal frontal and frontal ethmoidal sign use ectomy's with tissue debridement, bilateral total ethmoidectomies, bilateral maxillary antrostomies with tissue debridement.  Results of nasal bone and cartilage surgical pathology dated 10 November 2018 refers to bone containing  benign squama respiratory mucosa with chronic inflammatory consistent with chronic sinusitis.   Assessment and Plan:    1. Perennial allergic rhinitis   2. Chronic pansinusitis   3. Recurrent infections   4. Gastroesophageal reflux disease, unspecified whether esophagitis present   5. Essential hypertension     1.  Allergen avoidance measures?  2.  Treat and prevent inflammation:   A.  Montelukast 10 mg - 1 tablet 1 time per day  B.  OTC Nasacort - 2 sprays each nostril 1 time per day   3.  Treat and prevent reflux:   A.  Consolidate all forms of caffeine consumption  4.  Treat infection:   A.  Extend Augmentin use for a full 3 weeks  5.  If needed:   A.  Cetirizine 10 mg - 1 tablet twice a day  B.  Nasal saline multiple times a day  6.  Blood - IgA/G/M, antipneumococcal antibodies, antitetanus antibody, area 2 aero allergen profile.  7.  Return to clinic in 4 weeks or earlier if problem  8.  BP = 154/60  Toccora has an inflamed and apparently chronically infected upper airway for which we will have her utilize a collection of anti-inflammatory agents for her upper airway as well as a prolonged course of antibiotics and have her consolidate her caffeine consumption in the treatment of what may be a component of reflux induced respiratory disease.  I am going to check her immune system to make sure that she can respond to specific antigenic exposure and also to further define her aero allergen hypersensitivity profile.  We did inform her about her blood pressure today with and asked her to follow-up with her primary care doctor regarding this issue.  I will see her back in this clinic in 4 weeks or earlier if there is a problem.  Jiles Prows, MD Allergy / Immunology [facility] [employer]

## 2019-10-24 NOTE — Patient Instructions (Addendum)
  1.  Allergen avoidance measures?  2.  Treat and prevent inflammation:   A.  Montelukast 10 mg - 1 tablet 1 time per day  B.  OTC Nasacort - 2 sprays each nostril 1 time per day  3.  Treat and prevent reflux:   A.  Consolidate all forms of caffeine consumption  4.  Treat infection:   A.  Extend Augmentin use for a full 3 weeks  5.  If needed:   A.  Cetirizine 10 mg - 1 tablet twice a day  B.  Nasal saline multiple times a day  6.  Blood - IgA/G/M, antipneumococcal antibodies, antitetanus antibody, area 2 aero allergen profile.  7.  Return to clinic in 4 weeks or earlier if problem  8.  BP = 154/60

## 2019-10-25 ENCOUNTER — Encounter: Payer: Self-pay | Admitting: Allergy and Immunology

## 2019-11-01 DIAGNOSIS — E119 Type 2 diabetes mellitus without complications: Secondary | ICD-10-CM | POA: Diagnosis not present

## 2019-11-01 LAB — ALLERGENS W/TOTAL IGE AREA 2

## 2019-11-01 LAB — STREP PNEUMONIAE 23 SEROTYPES IGG
Pneumo Ab Type 1*: 0.2 ug/mL — ABNORMAL LOW (ref >1.3)
Pneumo Ab Type 12 (12F)*: <0.1 ug/mL — ABNORMAL LOW (ref >1.3)
Pneumo Ab Type 14*: 0.1 ug/mL — ABNORMAL LOW (ref >1.3)
Pneumo Ab Type 17 (17F)*: 0.2 ug/mL — ABNORMAL LOW (ref >1.3)
Pneumo Ab Type 19 (19F)*: 0.5 ug/mL — ABNORMAL LOW (ref >1.3)
Pneumo Ab Type 2*: 0.2 ug/mL — ABNORMAL LOW (ref >1.3)
Pneumo Ab Type 20*: 0.2 ug/mL — ABNORMAL LOW (ref >1.3)
Pneumo Ab Type 22 (22F)*: 0.3 ug/mL — ABNORMAL LOW (ref >1.3)
Pneumo Ab Type 23 (23F)*: 0.2 ug/mL — ABNORMAL LOW (ref >1.3)
Pneumo Ab Type 26 (6B)*: 0.5 ug/mL — ABNORMAL LOW (ref >1.3)
Pneumo Ab Type 3*: 3.8 ug/mL (ref >1.3)
Pneumo Ab Type 34 (10A)*: 0.3 ug/mL — ABNORMAL LOW (ref >1.3)
Pneumo Ab Type 4*: <0.1 ug/mL — ABNORMAL LOW (ref >1.3)
Pneumo Ab Type 43 (11A)*: <0.1 ug/mL — ABNORMAL LOW (ref >1.3)
Pneumo Ab Type 5*: 0.3 ug/mL — ABNORMAL LOW (ref >1.3)
Pneumo Ab Type 51 (7F)*: 1.1 ug/mL — ABNORMAL LOW (ref >1.3)
Pneumo Ab Type 54 (15B)*: <0.1 ug/mL — ABNORMAL LOW (ref >1.3)
Pneumo Ab Type 56 (18C)*: 0.4 ug/mL — ABNORMAL LOW (ref >1.3)
Pneumo Ab Type 57 (19A)*: 1.2 ug/mL — ABNORMAL LOW (ref >1.3)
Pneumo Ab Type 68 (9V)*: 0.1 ug/mL — ABNORMAL LOW (ref >1.3)
Pneumo Ab Type 70 (33F)*: 0.2 ug/mL — ABNORMAL LOW (ref >1.3)
Pneumo Ab Type 8*: 6.6 ug/mL (ref >1.3)
Pneumo Ab Type 9 (9N)*: <0.1 ug/mL — ABNORMAL LOW (ref >1.3)

## 2019-11-01 LAB — TETANUS ANTIBODY, IGG: Tetanus Ab, IgG: 0.5 IU/mL (ref <0.10)

## 2019-11-01 LAB — IGG, IGA, IGM
IgA/Immunoglobulin A, Serum: 64 mg/dL — ABNORMAL LOW (ref 87–352)
IgG (Immunoglobin G), Serum: 682 mg/dL (ref 586–1602)
IgM (Immunoglobulin M), Srm: 43 mg/dL (ref 26–217)

## 2019-11-02 ENCOUNTER — Encounter: Payer: Self-pay | Admitting: *Deleted

## 2019-11-03 DIAGNOSIS — Z23 Encounter for immunization: Secondary | ICD-10-CM | POA: Diagnosis not present

## 2019-11-11 DIAGNOSIS — M62838 Other muscle spasm: Secondary | ICD-10-CM | POA: Diagnosis not present

## 2019-11-14 DIAGNOSIS — M5431 Sciatica, right side: Secondary | ICD-10-CM | POA: Diagnosis not present

## 2019-11-16 DIAGNOSIS — M5431 Sciatica, right side: Secondary | ICD-10-CM | POA: Diagnosis not present

## 2019-11-16 DIAGNOSIS — M62830 Muscle spasm of back: Secondary | ICD-10-CM | POA: Diagnosis not present

## 2019-11-16 DIAGNOSIS — E1149 Type 2 diabetes mellitus with other diabetic neurological complication: Secondary | ICD-10-CM | POA: Diagnosis not present

## 2019-11-16 DIAGNOSIS — R609 Edema, unspecified: Secondary | ICD-10-CM | POA: Diagnosis not present

## 2019-11-21 ENCOUNTER — Ambulatory Visit: Payer: BC Managed Care – PPO | Admitting: Allergy and Immunology

## 2019-11-28 DIAGNOSIS — Z6841 Body Mass Index (BMI) 40.0 and over, adult: Secondary | ICD-10-CM | POA: Diagnosis not present

## 2019-11-28 DIAGNOSIS — M5431 Sciatica, right side: Secondary | ICD-10-CM | POA: Diagnosis not present

## 2019-11-29 DIAGNOSIS — M545 Low back pain: Secondary | ICD-10-CM | POA: Diagnosis not present

## 2019-12-05 ENCOUNTER — Ambulatory Visit: Payer: BC Managed Care – PPO | Admitting: Cardiology

## 2019-12-06 DIAGNOSIS — R2689 Other abnormalities of gait and mobility: Secondary | ICD-10-CM | POA: Diagnosis not present

## 2019-12-06 DIAGNOSIS — M6281 Muscle weakness (generalized): Secondary | ICD-10-CM | POA: Diagnosis not present

## 2019-12-06 DIAGNOSIS — M545 Low back pain: Secondary | ICD-10-CM | POA: Diagnosis not present

## 2019-12-06 DIAGNOSIS — M79661 Pain in right lower leg: Secondary | ICD-10-CM | POA: Diagnosis not present

## 2019-12-07 DIAGNOSIS — M5137 Other intervertebral disc degeneration, lumbosacral region: Secondary | ICD-10-CM | POA: Diagnosis not present

## 2019-12-07 DIAGNOSIS — M5441 Lumbago with sciatica, right side: Secondary | ICD-10-CM | POA: Diagnosis not present

## 2019-12-07 DIAGNOSIS — Z6841 Body Mass Index (BMI) 40.0 and over, adult: Secondary | ICD-10-CM | POA: Diagnosis not present

## 2019-12-08 DIAGNOSIS — M4726 Other spondylosis with radiculopathy, lumbar region: Secondary | ICD-10-CM | POA: Diagnosis not present

## 2019-12-08 DIAGNOSIS — M549 Dorsalgia, unspecified: Secondary | ICD-10-CM | POA: Diagnosis not present

## 2019-12-08 DIAGNOSIS — M545 Low back pain: Secondary | ICD-10-CM | POA: Diagnosis not present

## 2019-12-08 DIAGNOSIS — M5431 Sciatica, right side: Secondary | ICD-10-CM | POA: Diagnosis not present

## 2019-12-09 DIAGNOSIS — M545 Low back pain: Secondary | ICD-10-CM | POA: Diagnosis not present

## 2019-12-09 DIAGNOSIS — M6281 Muscle weakness (generalized): Secondary | ICD-10-CM | POA: Diagnosis not present

## 2019-12-09 DIAGNOSIS — M79661 Pain in right lower leg: Secondary | ICD-10-CM | POA: Diagnosis not present

## 2019-12-09 DIAGNOSIS — R2689 Other abnormalities of gait and mobility: Secondary | ICD-10-CM | POA: Diagnosis not present

## 2019-12-12 DIAGNOSIS — R2689 Other abnormalities of gait and mobility: Secondary | ICD-10-CM | POA: Diagnosis not present

## 2019-12-12 DIAGNOSIS — M79661 Pain in right lower leg: Secondary | ICD-10-CM | POA: Diagnosis not present

## 2019-12-12 DIAGNOSIS — M6281 Muscle weakness (generalized): Secondary | ICD-10-CM | POA: Diagnosis not present

## 2019-12-12 DIAGNOSIS — M545 Low back pain: Secondary | ICD-10-CM | POA: Diagnosis not present

## 2019-12-14 DIAGNOSIS — M48061 Spinal stenosis, lumbar region without neurogenic claudication: Secondary | ICD-10-CM | POA: Diagnosis not present

## 2019-12-16 DIAGNOSIS — M79661 Pain in right lower leg: Secondary | ICD-10-CM | POA: Diagnosis not present

## 2019-12-16 DIAGNOSIS — M6281 Muscle weakness (generalized): Secondary | ICD-10-CM | POA: Diagnosis not present

## 2019-12-16 DIAGNOSIS — R2689 Other abnormalities of gait and mobility: Secondary | ICD-10-CM | POA: Diagnosis not present

## 2019-12-16 DIAGNOSIS — M545 Low back pain: Secondary | ICD-10-CM | POA: Diagnosis not present

## 2019-12-20 DIAGNOSIS — M5116 Intervertebral disc disorders with radiculopathy, lumbar region: Secondary | ICD-10-CM | POA: Diagnosis not present

## 2019-12-20 DIAGNOSIS — M4726 Other spondylosis with radiculopathy, lumbar region: Secondary | ICD-10-CM | POA: Diagnosis not present

## 2019-12-20 DIAGNOSIS — M5431 Sciatica, right side: Secondary | ICD-10-CM | POA: Diagnosis not present

## 2019-12-20 DIAGNOSIS — M545 Low back pain: Secondary | ICD-10-CM | POA: Diagnosis not present

## 2019-12-20 DIAGNOSIS — M6281 Muscle weakness (generalized): Secondary | ICD-10-CM | POA: Diagnosis not present

## 2019-12-20 DIAGNOSIS — M79661 Pain in right lower leg: Secondary | ICD-10-CM | POA: Diagnosis not present

## 2019-12-20 DIAGNOSIS — M48062 Spinal stenosis, lumbar region with neurogenic claudication: Secondary | ICD-10-CM | POA: Diagnosis not present

## 2019-12-20 DIAGNOSIS — R2689 Other abnormalities of gait and mobility: Secondary | ICD-10-CM | POA: Diagnosis not present

## 2019-12-22 DIAGNOSIS — Z01818 Encounter for other preprocedural examination: Secondary | ICD-10-CM | POA: Diagnosis not present

## 2019-12-22 DIAGNOSIS — M48061 Spinal stenosis, lumbar region without neurogenic claudication: Secondary | ICD-10-CM | POA: Diagnosis not present

## 2019-12-22 DIAGNOSIS — Z20822 Contact with and (suspected) exposure to covid-19: Secondary | ICD-10-CM | POA: Diagnosis not present

## 2019-12-22 DIAGNOSIS — Z01812 Encounter for preprocedural laboratory examination: Secondary | ICD-10-CM | POA: Diagnosis not present

## 2019-12-23 DIAGNOSIS — M4726 Other spondylosis with radiculopathy, lumbar region: Secondary | ICD-10-CM | POA: Diagnosis not present

## 2019-12-23 DIAGNOSIS — M5431 Sciatica, right side: Secondary | ICD-10-CM | POA: Diagnosis not present

## 2019-12-23 DIAGNOSIS — M48062 Spinal stenosis, lumbar region with neurogenic claudication: Secondary | ICD-10-CM | POA: Diagnosis not present

## 2019-12-23 DIAGNOSIS — M5116 Intervertebral disc disorders with radiculopathy, lumbar region: Secondary | ICD-10-CM | POA: Diagnosis not present

## 2019-12-26 DIAGNOSIS — M48061 Spinal stenosis, lumbar region without neurogenic claudication: Secondary | ICD-10-CM | POA: Diagnosis not present

## 2019-12-26 DIAGNOSIS — Z01812 Encounter for preprocedural laboratory examination: Secondary | ICD-10-CM | POA: Diagnosis not present

## 2019-12-26 DIAGNOSIS — Z20822 Contact with and (suspected) exposure to covid-19: Secondary | ICD-10-CM | POA: Diagnosis not present

## 2019-12-26 DIAGNOSIS — Z01818 Encounter for other preprocedural examination: Secondary | ICD-10-CM | POA: Diagnosis not present

## 2019-12-27 DIAGNOSIS — I444 Left anterior fascicular block: Secondary | ICD-10-CM | POA: Diagnosis not present

## 2019-12-28 DIAGNOSIS — M5441 Lumbago with sciatica, right side: Secondary | ICD-10-CM | POA: Diagnosis not present

## 2019-12-28 DIAGNOSIS — M47816 Spondylosis without myelopathy or radiculopathy, lumbar region: Secondary | ICD-10-CM | POA: Diagnosis not present

## 2019-12-28 DIAGNOSIS — Z981 Arthrodesis status: Secondary | ICD-10-CM | POA: Diagnosis not present

## 2019-12-28 DIAGNOSIS — E119 Type 2 diabetes mellitus without complications: Secondary | ICD-10-CM | POA: Diagnosis not present

## 2019-12-28 DIAGNOSIS — M4326 Fusion of spine, lumbar region: Secondary | ICD-10-CM | POA: Diagnosis not present

## 2019-12-28 DIAGNOSIS — M48062 Spinal stenosis, lumbar region with neurogenic claudication: Secondary | ICD-10-CM | POA: Diagnosis not present

## 2019-12-28 DIAGNOSIS — F17210 Nicotine dependence, cigarettes, uncomplicated: Secondary | ICD-10-CM | POA: Diagnosis not present

## 2019-12-28 DIAGNOSIS — G9741 Accidental puncture or laceration of dura during a procedure: Secondary | ICD-10-CM | POA: Diagnosis not present

## 2019-12-28 DIAGNOSIS — M4726 Other spondylosis with radiculopathy, lumbar region: Secondary | ICD-10-CM | POA: Diagnosis not present

## 2019-12-28 DIAGNOSIS — Z794 Long term (current) use of insulin: Secondary | ICD-10-CM | POA: Diagnosis not present

## 2019-12-28 DIAGNOSIS — Z6841 Body Mass Index (BMI) 40.0 and over, adult: Secondary | ICD-10-CM | POA: Diagnosis not present

## 2019-12-28 DIAGNOSIS — M5431 Sciatica, right side: Secondary | ICD-10-CM | POA: Diagnosis not present

## 2019-12-28 DIAGNOSIS — M5126 Other intervertebral disc displacement, lumbar region: Secondary | ICD-10-CM | POA: Diagnosis not present

## 2019-12-28 DIAGNOSIS — M5116 Intervertebral disc disorders with radiculopathy, lumbar region: Secondary | ICD-10-CM | POA: Insufficient documentation

## 2019-12-28 DIAGNOSIS — Z20822 Contact with and (suspected) exposure to covid-19: Secondary | ICD-10-CM | POA: Diagnosis not present

## 2019-12-28 HISTORY — DX: Intervertebral disc disorders with radiculopathy, lumbar region: M51.16

## 2019-12-29 DIAGNOSIS — M48062 Spinal stenosis, lumbar region with neurogenic claudication: Secondary | ICD-10-CM | POA: Diagnosis not present

## 2019-12-29 DIAGNOSIS — Z20822 Contact with and (suspected) exposure to covid-19: Secondary | ICD-10-CM | POA: Diagnosis not present

## 2019-12-29 DIAGNOSIS — Z794 Long term (current) use of insulin: Secondary | ICD-10-CM | POA: Diagnosis not present

## 2019-12-29 DIAGNOSIS — M5441 Lumbago with sciatica, right side: Secondary | ICD-10-CM | POA: Diagnosis not present

## 2019-12-29 DIAGNOSIS — M4726 Other spondylosis with radiculopathy, lumbar region: Secondary | ICD-10-CM | POA: Diagnosis not present

## 2019-12-29 DIAGNOSIS — F17210 Nicotine dependence, cigarettes, uncomplicated: Secondary | ICD-10-CM | POA: Diagnosis not present

## 2019-12-29 DIAGNOSIS — Z6841 Body Mass Index (BMI) 40.0 and over, adult: Secondary | ICD-10-CM | POA: Diagnosis not present

## 2019-12-29 DIAGNOSIS — G9741 Accidental puncture or laceration of dura during a procedure: Secondary | ICD-10-CM | POA: Diagnosis not present

## 2019-12-29 DIAGNOSIS — E119 Type 2 diabetes mellitus without complications: Secondary | ICD-10-CM | POA: Diagnosis not present

## 2019-12-30 DIAGNOSIS — Z20822 Contact with and (suspected) exposure to covid-19: Secondary | ICD-10-CM | POA: Diagnosis not present

## 2019-12-30 DIAGNOSIS — M48062 Spinal stenosis, lumbar region with neurogenic claudication: Secondary | ICD-10-CM | POA: Diagnosis not present

## 2019-12-30 DIAGNOSIS — Z6841 Body Mass Index (BMI) 40.0 and over, adult: Secondary | ICD-10-CM | POA: Diagnosis not present

## 2019-12-30 DIAGNOSIS — Z794 Long term (current) use of insulin: Secondary | ICD-10-CM | POA: Diagnosis not present

## 2019-12-30 DIAGNOSIS — M5441 Lumbago with sciatica, right side: Secondary | ICD-10-CM | POA: Diagnosis not present

## 2019-12-30 DIAGNOSIS — G9741 Accidental puncture or laceration of dura during a procedure: Secondary | ICD-10-CM | POA: Diagnosis not present

## 2019-12-30 DIAGNOSIS — E119 Type 2 diabetes mellitus without complications: Secondary | ICD-10-CM | POA: Diagnosis not present

## 2019-12-30 DIAGNOSIS — M4726 Other spondylosis with radiculopathy, lumbar region: Secondary | ICD-10-CM | POA: Diagnosis not present

## 2019-12-30 DIAGNOSIS — F17210 Nicotine dependence, cigarettes, uncomplicated: Secondary | ICD-10-CM | POA: Diagnosis not present

## 2019-12-31 DIAGNOSIS — M4726 Other spondylosis with radiculopathy, lumbar region: Secondary | ICD-10-CM | POA: Diagnosis not present

## 2019-12-31 DIAGNOSIS — Z20822 Contact with and (suspected) exposure to covid-19: Secondary | ICD-10-CM | POA: Diagnosis not present

## 2019-12-31 DIAGNOSIS — F17210 Nicotine dependence, cigarettes, uncomplicated: Secondary | ICD-10-CM | POA: Diagnosis not present

## 2019-12-31 DIAGNOSIS — Z6841 Body Mass Index (BMI) 40.0 and over, adult: Secondary | ICD-10-CM | POA: Diagnosis not present

## 2019-12-31 DIAGNOSIS — Z794 Long term (current) use of insulin: Secondary | ICD-10-CM | POA: Diagnosis not present

## 2019-12-31 DIAGNOSIS — M48062 Spinal stenosis, lumbar region with neurogenic claudication: Secondary | ICD-10-CM | POA: Diagnosis not present

## 2019-12-31 DIAGNOSIS — M4326 Fusion of spine, lumbar region: Secondary | ICD-10-CM | POA: Diagnosis not present

## 2019-12-31 DIAGNOSIS — G9741 Accidental puncture or laceration of dura during a procedure: Secondary | ICD-10-CM | POA: Diagnosis not present

## 2019-12-31 DIAGNOSIS — M5441 Lumbago with sciatica, right side: Secondary | ICD-10-CM | POA: Diagnosis not present

## 2019-12-31 DIAGNOSIS — Z981 Arthrodesis status: Secondary | ICD-10-CM | POA: Diagnosis not present

## 2019-12-31 DIAGNOSIS — E119 Type 2 diabetes mellitus without complications: Secondary | ICD-10-CM | POA: Diagnosis not present

## 2020-01-01 DIAGNOSIS — M48062 Spinal stenosis, lumbar region with neurogenic claudication: Secondary | ICD-10-CM | POA: Diagnosis not present

## 2020-01-01 DIAGNOSIS — M4726 Other spondylosis with radiculopathy, lumbar region: Secondary | ICD-10-CM | POA: Diagnosis not present

## 2020-01-01 DIAGNOSIS — G9741 Accidental puncture or laceration of dura during a procedure: Secondary | ICD-10-CM | POA: Diagnosis not present

## 2020-01-01 DIAGNOSIS — Z6841 Body Mass Index (BMI) 40.0 and over, adult: Secondary | ICD-10-CM | POA: Diagnosis not present

## 2020-01-01 DIAGNOSIS — Z794 Long term (current) use of insulin: Secondary | ICD-10-CM | POA: Diagnosis not present

## 2020-01-01 DIAGNOSIS — Z20822 Contact with and (suspected) exposure to covid-19: Secondary | ICD-10-CM | POA: Diagnosis not present

## 2020-01-01 DIAGNOSIS — F17210 Nicotine dependence, cigarettes, uncomplicated: Secondary | ICD-10-CM | POA: Diagnosis not present

## 2020-01-01 DIAGNOSIS — E119 Type 2 diabetes mellitus without complications: Secondary | ICD-10-CM | POA: Diagnosis not present

## 2020-01-01 DIAGNOSIS — M5441 Lumbago with sciatica, right side: Secondary | ICD-10-CM | POA: Diagnosis not present

## 2020-01-02 DIAGNOSIS — F17210 Nicotine dependence, cigarettes, uncomplicated: Secondary | ICD-10-CM | POA: Diagnosis not present

## 2020-01-02 DIAGNOSIS — Z20822 Contact with and (suspected) exposure to covid-19: Secondary | ICD-10-CM | POA: Diagnosis not present

## 2020-01-02 DIAGNOSIS — M4720 Other spondylosis with radiculopathy, site unspecified: Secondary | ICD-10-CM | POA: Diagnosis not present

## 2020-01-02 DIAGNOSIS — Z741 Need for assistance with personal care: Secondary | ICD-10-CM | POA: Diagnosis not present

## 2020-01-02 DIAGNOSIS — R279 Unspecified lack of coordination: Secondary | ICD-10-CM | POA: Diagnosis not present

## 2020-01-02 DIAGNOSIS — M48062 Spinal stenosis, lumbar region with neurogenic claudication: Secondary | ICD-10-CM | POA: Diagnosis not present

## 2020-01-02 DIAGNOSIS — R319 Hematuria, unspecified: Secondary | ICD-10-CM | POA: Diagnosis not present

## 2020-01-02 DIAGNOSIS — Z794 Long term (current) use of insulin: Secondary | ICD-10-CM | POA: Diagnosis not present

## 2020-01-02 DIAGNOSIS — E7800 Pure hypercholesterolemia, unspecified: Secondary | ICD-10-CM | POA: Diagnosis not present

## 2020-01-02 DIAGNOSIS — G40909 Epilepsy, unspecified, not intractable, without status epilepticus: Secondary | ICD-10-CM | POA: Diagnosis not present

## 2020-01-02 DIAGNOSIS — R269 Unspecified abnormalities of gait and mobility: Secondary | ICD-10-CM | POA: Diagnosis not present

## 2020-01-02 DIAGNOSIS — R262 Difficulty in walking, not elsewhere classified: Secondary | ICD-10-CM | POA: Diagnosis not present

## 2020-01-02 DIAGNOSIS — G629 Polyneuropathy, unspecified: Secondary | ICD-10-CM | POA: Diagnosis not present

## 2020-01-02 DIAGNOSIS — G9741 Accidental puncture or laceration of dura during a procedure: Secondary | ICD-10-CM | POA: Diagnosis not present

## 2020-01-02 DIAGNOSIS — T8189XA Other complications of procedures, not elsewhere classified, initial encounter: Secondary | ICD-10-CM | POA: Diagnosis not present

## 2020-01-02 DIAGNOSIS — E785 Hyperlipidemia, unspecified: Secondary | ICD-10-CM | POA: Diagnosis not present

## 2020-01-02 DIAGNOSIS — M5431 Sciatica, right side: Secondary | ICD-10-CM | POA: Diagnosis not present

## 2020-01-02 DIAGNOSIS — Z6841 Body Mass Index (BMI) 40.0 and over, adult: Secondary | ICD-10-CM | POA: Diagnosis not present

## 2020-01-02 DIAGNOSIS — Z79899 Other long term (current) drug therapy: Secondary | ICD-10-CM | POA: Diagnosis not present

## 2020-01-02 DIAGNOSIS — E119 Type 2 diabetes mellitus without complications: Secondary | ICD-10-CM | POA: Diagnosis not present

## 2020-01-02 DIAGNOSIS — N390 Urinary tract infection, site not specified: Secondary | ICD-10-CM | POA: Diagnosis not present

## 2020-01-02 DIAGNOSIS — M6281 Muscle weakness (generalized): Secondary | ICD-10-CM | POA: Diagnosis not present

## 2020-01-02 DIAGNOSIS — Z7401 Bed confinement status: Secondary | ICD-10-CM | POA: Diagnosis not present

## 2020-01-02 DIAGNOSIS — M4726 Other spondylosis with radiculopathy, lumbar region: Secondary | ICD-10-CM | POA: Diagnosis not present

## 2020-01-02 DIAGNOSIS — M2550 Pain in unspecified joint: Secondary | ICD-10-CM | POA: Diagnosis not present

## 2020-01-02 DIAGNOSIS — M4326 Fusion of spine, lumbar region: Secondary | ICD-10-CM | POA: Diagnosis not present

## 2020-01-02 DIAGNOSIS — I10 Essential (primary) hypertension: Secondary | ICD-10-CM | POA: Diagnosis not present

## 2020-01-02 DIAGNOSIS — M5116 Intervertebral disc disorders with radiculopathy, lumbar region: Secondary | ICD-10-CM | POA: Diagnosis not present

## 2020-01-02 DIAGNOSIS — I959 Hypotension, unspecified: Secondary | ICD-10-CM | POA: Diagnosis not present

## 2020-01-02 DIAGNOSIS — E1142 Type 2 diabetes mellitus with diabetic polyneuropathy: Secondary | ICD-10-CM | POA: Diagnosis not present

## 2020-01-02 DIAGNOSIS — M5441 Lumbago with sciatica, right side: Secondary | ICD-10-CM | POA: Diagnosis not present

## 2020-01-02 DIAGNOSIS — E1165 Type 2 diabetes mellitus with hyperglycemia: Secondary | ICD-10-CM | POA: Diagnosis not present

## 2020-01-04 DIAGNOSIS — E1165 Type 2 diabetes mellitus with hyperglycemia: Secondary | ICD-10-CM | POA: Diagnosis not present

## 2020-01-04 DIAGNOSIS — M4720 Other spondylosis with radiculopathy, site unspecified: Secondary | ICD-10-CM | POA: Diagnosis not present

## 2020-01-04 DIAGNOSIS — I10 Essential (primary) hypertension: Secondary | ICD-10-CM | POA: Diagnosis not present

## 2020-01-04 DIAGNOSIS — E785 Hyperlipidemia, unspecified: Secondary | ICD-10-CM | POA: Diagnosis not present

## 2020-01-26 DIAGNOSIS — M4326 Fusion of spine, lumbar region: Secondary | ICD-10-CM | POA: Diagnosis not present

## 2020-02-02 DIAGNOSIS — I10 Essential (primary) hypertension: Secondary | ICD-10-CM | POA: Diagnosis not present

## 2020-02-02 DIAGNOSIS — E785 Hyperlipidemia, unspecified: Secondary | ICD-10-CM | POA: Diagnosis not present

## 2020-02-02 DIAGNOSIS — M4720 Other spondylosis with radiculopathy, site unspecified: Secondary | ICD-10-CM | POA: Diagnosis not present

## 2020-02-02 DIAGNOSIS — E1165 Type 2 diabetes mellitus with hyperglycemia: Secondary | ICD-10-CM | POA: Diagnosis not present

## 2020-02-09 DIAGNOSIS — B3781 Candidal esophagitis: Secondary | ICD-10-CM | POA: Diagnosis not present

## 2020-02-09 DIAGNOSIS — F411 Generalized anxiety disorder: Secondary | ICD-10-CM | POA: Diagnosis not present

## 2020-02-09 DIAGNOSIS — F4323 Adjustment disorder with mixed anxiety and depressed mood: Secondary | ICD-10-CM | POA: Diagnosis not present

## 2020-02-09 DIAGNOSIS — E1149 Type 2 diabetes mellitus with other diabetic neurological complication: Secondary | ICD-10-CM | POA: Diagnosis not present

## 2020-02-09 DIAGNOSIS — B370 Candidal stomatitis: Secondary | ICD-10-CM | POA: Diagnosis not present

## 2020-02-16 DIAGNOSIS — L84 Corns and callosities: Secondary | ICD-10-CM | POA: Insufficient documentation

## 2020-02-16 DIAGNOSIS — L608 Other nail disorders: Secondary | ICD-10-CM | POA: Insufficient documentation

## 2020-02-16 HISTORY — DX: Corns and callosities: L84

## 2020-02-16 HISTORY — DX: Other nail disorders: L60.8

## 2020-02-28 DIAGNOSIS — M539 Dorsopathy, unspecified: Secondary | ICD-10-CM | POA: Diagnosis not present

## 2020-02-28 DIAGNOSIS — N390 Urinary tract infection, site not specified: Secondary | ICD-10-CM | POA: Diagnosis not present

## 2020-02-28 DIAGNOSIS — E1149 Type 2 diabetes mellitus with other diabetic neurological complication: Secondary | ICD-10-CM | POA: Diagnosis not present

## 2020-02-28 DIAGNOSIS — G629 Polyneuropathy, unspecified: Secondary | ICD-10-CM | POA: Diagnosis not present

## 2020-03-13 DIAGNOSIS — M48062 Spinal stenosis, lumbar region with neurogenic claudication: Secondary | ICD-10-CM | POA: Diagnosis not present

## 2020-03-13 DIAGNOSIS — M4326 Fusion of spine, lumbar region: Secondary | ICD-10-CM | POA: Diagnosis not present

## 2020-03-13 DIAGNOSIS — M4726 Other spondylosis with radiculopathy, lumbar region: Secondary | ICD-10-CM | POA: Diagnosis not present

## 2020-03-13 DIAGNOSIS — M5116 Intervertebral disc disorders with radiculopathy, lumbar region: Secondary | ICD-10-CM | POA: Diagnosis not present

## 2020-03-20 DIAGNOSIS — R5381 Other malaise: Secondary | ICD-10-CM | POA: Diagnosis not present

## 2020-03-20 DIAGNOSIS — R531 Weakness: Secondary | ICD-10-CM | POA: Diagnosis not present

## 2020-03-20 DIAGNOSIS — M2560 Stiffness of unspecified joint, not elsewhere classified: Secondary | ICD-10-CM | POA: Diagnosis not present

## 2020-03-20 DIAGNOSIS — M545 Low back pain: Secondary | ICD-10-CM | POA: Diagnosis not present

## 2020-03-23 DIAGNOSIS — R531 Weakness: Secondary | ICD-10-CM | POA: Diagnosis not present

## 2020-03-23 DIAGNOSIS — R5381 Other malaise: Secondary | ICD-10-CM | POA: Diagnosis not present

## 2020-03-23 DIAGNOSIS — M545 Low back pain: Secondary | ICD-10-CM | POA: Diagnosis not present

## 2020-03-23 DIAGNOSIS — M2560 Stiffness of unspecified joint, not elsewhere classified: Secondary | ICD-10-CM | POA: Diagnosis not present

## 2020-03-27 DIAGNOSIS — R5381 Other malaise: Secondary | ICD-10-CM | POA: Diagnosis not present

## 2020-03-27 DIAGNOSIS — S161XXA Strain of muscle, fascia and tendon at neck level, initial encounter: Secondary | ICD-10-CM | POA: Diagnosis not present

## 2020-03-27 DIAGNOSIS — Z6836 Body mass index (BMI) 36.0-36.9, adult: Secondary | ICD-10-CM | POA: Diagnosis not present

## 2020-03-27 DIAGNOSIS — M2560 Stiffness of unspecified joint, not elsewhere classified: Secondary | ICD-10-CM | POA: Diagnosis not present

## 2020-03-27 DIAGNOSIS — R531 Weakness: Secondary | ICD-10-CM | POA: Diagnosis not present

## 2020-03-27 DIAGNOSIS — M545 Low back pain: Secondary | ICD-10-CM | POA: Diagnosis not present

## 2020-03-27 DIAGNOSIS — R519 Headache, unspecified: Secondary | ICD-10-CM | POA: Diagnosis not present

## 2020-03-29 DIAGNOSIS — M545 Low back pain: Secondary | ICD-10-CM | POA: Diagnosis not present

## 2020-03-29 DIAGNOSIS — R531 Weakness: Secondary | ICD-10-CM | POA: Diagnosis not present

## 2020-03-29 DIAGNOSIS — M2560 Stiffness of unspecified joint, not elsewhere classified: Secondary | ICD-10-CM | POA: Diagnosis not present

## 2020-03-29 DIAGNOSIS — R5381 Other malaise: Secondary | ICD-10-CM | POA: Diagnosis not present

## 2020-04-03 DIAGNOSIS — R531 Weakness: Secondary | ICD-10-CM | POA: Diagnosis not present

## 2020-04-03 DIAGNOSIS — M545 Low back pain: Secondary | ICD-10-CM | POA: Diagnosis not present

## 2020-04-03 DIAGNOSIS — R5381 Other malaise: Secondary | ICD-10-CM | POA: Diagnosis not present

## 2020-04-03 DIAGNOSIS — M2560 Stiffness of unspecified joint, not elsewhere classified: Secondary | ICD-10-CM | POA: Diagnosis not present

## 2020-04-05 DIAGNOSIS — R531 Weakness: Secondary | ICD-10-CM | POA: Diagnosis not present

## 2020-04-05 DIAGNOSIS — M545 Low back pain: Secondary | ICD-10-CM | POA: Diagnosis not present

## 2020-04-05 DIAGNOSIS — R5381 Other malaise: Secondary | ICD-10-CM | POA: Diagnosis not present

## 2020-04-05 DIAGNOSIS — M2560 Stiffness of unspecified joint, not elsewhere classified: Secondary | ICD-10-CM | POA: Diagnosis not present

## 2020-04-10 DIAGNOSIS — M545 Low back pain: Secondary | ICD-10-CM | POA: Diagnosis not present

## 2020-04-10 DIAGNOSIS — M2560 Stiffness of unspecified joint, not elsewhere classified: Secondary | ICD-10-CM | POA: Diagnosis not present

## 2020-04-10 DIAGNOSIS — R5381 Other malaise: Secondary | ICD-10-CM | POA: Diagnosis not present

## 2020-04-10 DIAGNOSIS — R531 Weakness: Secondary | ICD-10-CM | POA: Diagnosis not present

## 2020-04-13 DIAGNOSIS — M2560 Stiffness of unspecified joint, not elsewhere classified: Secondary | ICD-10-CM | POA: Diagnosis not present

## 2020-04-13 DIAGNOSIS — R5381 Other malaise: Secondary | ICD-10-CM | POA: Diagnosis not present

## 2020-04-13 DIAGNOSIS — R531 Weakness: Secondary | ICD-10-CM | POA: Diagnosis not present

## 2020-04-13 DIAGNOSIS — M545 Low back pain: Secondary | ICD-10-CM | POA: Diagnosis not present

## 2020-04-17 DIAGNOSIS — M545 Low back pain: Secondary | ICD-10-CM | POA: Diagnosis not present

## 2020-04-17 DIAGNOSIS — M2560 Stiffness of unspecified joint, not elsewhere classified: Secondary | ICD-10-CM | POA: Diagnosis not present

## 2020-04-17 DIAGNOSIS — R531 Weakness: Secondary | ICD-10-CM | POA: Diagnosis not present

## 2020-04-17 DIAGNOSIS — R5381 Other malaise: Secondary | ICD-10-CM | POA: Diagnosis not present

## 2020-04-19 DIAGNOSIS — R531 Weakness: Secondary | ICD-10-CM | POA: Diagnosis not present

## 2020-04-19 DIAGNOSIS — M545 Low back pain: Secondary | ICD-10-CM | POA: Diagnosis not present

## 2020-04-19 DIAGNOSIS — M2560 Stiffness of unspecified joint, not elsewhere classified: Secondary | ICD-10-CM | POA: Diagnosis not present

## 2020-04-19 DIAGNOSIS — R5381 Other malaise: Secondary | ICD-10-CM | POA: Diagnosis not present

## 2020-04-24 DIAGNOSIS — M25561 Pain in right knee: Secondary | ICD-10-CM | POA: Diagnosis not present

## 2020-04-25 DIAGNOSIS — R531 Weakness: Secondary | ICD-10-CM | POA: Diagnosis not present

## 2020-04-25 DIAGNOSIS — R5381 Other malaise: Secondary | ICD-10-CM | POA: Diagnosis not present

## 2020-04-25 DIAGNOSIS — M545 Low back pain: Secondary | ICD-10-CM | POA: Diagnosis not present

## 2020-04-25 DIAGNOSIS — M2560 Stiffness of unspecified joint, not elsewhere classified: Secondary | ICD-10-CM | POA: Diagnosis not present

## 2020-04-27 DIAGNOSIS — M545 Low back pain: Secondary | ICD-10-CM | POA: Diagnosis not present

## 2020-04-27 DIAGNOSIS — M2560 Stiffness of unspecified joint, not elsewhere classified: Secondary | ICD-10-CM | POA: Diagnosis not present

## 2020-04-27 DIAGNOSIS — R5381 Other malaise: Secondary | ICD-10-CM | POA: Diagnosis not present

## 2020-04-27 DIAGNOSIS — R531 Weakness: Secondary | ICD-10-CM | POA: Diagnosis not present

## 2020-05-02 DIAGNOSIS — R5381 Other malaise: Secondary | ICD-10-CM | POA: Diagnosis not present

## 2020-05-02 DIAGNOSIS — R531 Weakness: Secondary | ICD-10-CM | POA: Diagnosis not present

## 2020-05-02 DIAGNOSIS — M545 Low back pain: Secondary | ICD-10-CM | POA: Diagnosis not present

## 2020-05-02 DIAGNOSIS — M2560 Stiffness of unspecified joint, not elsewhere classified: Secondary | ICD-10-CM | POA: Diagnosis not present

## 2020-05-04 DIAGNOSIS — M545 Low back pain: Secondary | ICD-10-CM | POA: Diagnosis not present

## 2020-05-04 DIAGNOSIS — M2560 Stiffness of unspecified joint, not elsewhere classified: Secondary | ICD-10-CM | POA: Diagnosis not present

## 2020-05-04 DIAGNOSIS — R531 Weakness: Secondary | ICD-10-CM | POA: Diagnosis not present

## 2020-05-04 DIAGNOSIS — R5381 Other malaise: Secondary | ICD-10-CM | POA: Diagnosis not present

## 2020-05-07 DIAGNOSIS — R5381 Other malaise: Secondary | ICD-10-CM | POA: Diagnosis not present

## 2020-05-07 DIAGNOSIS — M545 Low back pain: Secondary | ICD-10-CM | POA: Diagnosis not present

## 2020-05-07 DIAGNOSIS — R531 Weakness: Secondary | ICD-10-CM | POA: Diagnosis not present

## 2020-05-07 DIAGNOSIS — M2560 Stiffness of unspecified joint, not elsewhere classified: Secondary | ICD-10-CM | POA: Diagnosis not present

## 2020-05-09 DIAGNOSIS — M545 Low back pain: Secondary | ICD-10-CM | POA: Diagnosis not present

## 2020-05-09 DIAGNOSIS — Z6836 Body mass index (BMI) 36.0-36.9, adult: Secondary | ICD-10-CM | POA: Diagnosis not present

## 2020-05-10 DIAGNOSIS — M7918 Myalgia, other site: Secondary | ICD-10-CM | POA: Diagnosis not present

## 2020-05-10 DIAGNOSIS — M48062 Spinal stenosis, lumbar region with neurogenic claudication: Secondary | ICD-10-CM | POA: Diagnosis not present

## 2020-05-10 DIAGNOSIS — M7631 Iliotibial band syndrome, right leg: Secondary | ICD-10-CM | POA: Diagnosis not present

## 2020-05-10 DIAGNOSIS — M4326 Fusion of spine, lumbar region: Secondary | ICD-10-CM | POA: Diagnosis not present

## 2020-06-13 DIAGNOSIS — J329 Chronic sinusitis, unspecified: Secondary | ICD-10-CM | POA: Diagnosis not present

## 2020-06-13 DIAGNOSIS — Z6837 Body mass index (BMI) 37.0-37.9, adult: Secondary | ICD-10-CM | POA: Diagnosis not present

## 2020-06-13 DIAGNOSIS — R439 Unspecified disturbances of smell and taste: Secondary | ICD-10-CM | POA: Diagnosis not present

## 2020-07-14 DIAGNOSIS — M545 Low back pain: Secondary | ICD-10-CM | POA: Diagnosis not present

## 2020-07-14 DIAGNOSIS — M62838 Other muscle spasm: Secondary | ICD-10-CM | POA: Diagnosis not present

## 2020-07-16 DIAGNOSIS — M7631 Iliotibial band syndrome, right leg: Secondary | ICD-10-CM | POA: Diagnosis not present

## 2020-07-16 DIAGNOSIS — M7918 Myalgia, other site: Secondary | ICD-10-CM | POA: Diagnosis not present

## 2020-07-16 DIAGNOSIS — M48062 Spinal stenosis, lumbar region with neurogenic claudication: Secondary | ICD-10-CM | POA: Diagnosis not present

## 2020-07-17 DIAGNOSIS — M7918 Myalgia, other site: Secondary | ICD-10-CM | POA: Diagnosis not present

## 2020-07-17 DIAGNOSIS — M7631 Iliotibial band syndrome, right leg: Secondary | ICD-10-CM | POA: Diagnosis not present

## 2020-07-17 DIAGNOSIS — M48062 Spinal stenosis, lumbar region with neurogenic claudication: Secondary | ICD-10-CM | POA: Diagnosis not present

## 2020-07-24 DIAGNOSIS — M48062 Spinal stenosis, lumbar region with neurogenic claudication: Secondary | ICD-10-CM | POA: Diagnosis not present

## 2020-07-24 DIAGNOSIS — M4726 Other spondylosis with radiculopathy, lumbar region: Secondary | ICD-10-CM | POA: Diagnosis not present

## 2020-07-24 DIAGNOSIS — M4326 Fusion of spine, lumbar region: Secondary | ICD-10-CM | POA: Diagnosis not present

## 2020-08-30 ENCOUNTER — Other Ambulatory Visit: Payer: Self-pay | Admitting: Family Medicine

## 2020-08-30 DIAGNOSIS — S335XXA Sprain of ligaments of lumbar spine, initial encounter: Secondary | ICD-10-CM | POA: Diagnosis not present

## 2020-08-30 DIAGNOSIS — Z1231 Encounter for screening mammogram for malignant neoplasm of breast: Secondary | ICD-10-CM

## 2020-09-23 DIAGNOSIS — J0100 Acute maxillary sinusitis, unspecified: Secondary | ICD-10-CM | POA: Diagnosis not present

## 2020-09-23 DIAGNOSIS — Z20828 Contact with and (suspected) exposure to other viral communicable diseases: Secondary | ICD-10-CM | POA: Diagnosis not present

## 2020-09-25 ENCOUNTER — Ambulatory Visit
Admission: RE | Admit: 2020-09-25 | Discharge: 2020-09-25 | Disposition: A | Discharge status: Home / Self Care | Payer: BC Managed Care – PPO | Source: Ambulatory Visit | Attending: Family Medicine | Admitting: Family Medicine

## 2020-09-25 ENCOUNTER — Other Ambulatory Visit: Payer: Self-pay

## 2020-09-25 DIAGNOSIS — D225 Melanocytic nevi of trunk: Secondary | ICD-10-CM | POA: Diagnosis not present

## 2020-09-25 DIAGNOSIS — D1801 Hemangioma of skin and subcutaneous tissue: Secondary | ICD-10-CM | POA: Diagnosis not present

## 2020-09-25 DIAGNOSIS — Z85820 Personal history of malignant melanoma of skin: Secondary | ICD-10-CM | POA: Diagnosis not present

## 2020-09-25 DIAGNOSIS — D485 Neoplasm of uncertain behavior of skin: Secondary | ICD-10-CM | POA: Diagnosis not present

## 2020-09-25 DIAGNOSIS — Z1231 Encounter for screening mammogram for malignant neoplasm of breast: Secondary | ICD-10-CM

## 2020-09-25 DIAGNOSIS — D2239 Melanocytic nevi of other parts of face: Secondary | ICD-10-CM | POA: Diagnosis not present

## 2020-10-16 DIAGNOSIS — E119 Type 2 diabetes mellitus without complications: Secondary | ICD-10-CM | POA: Diagnosis not present

## 2020-10-31 DIAGNOSIS — M4726 Other spondylosis with radiculopathy, lumbar region: Secondary | ICD-10-CM | POA: Diagnosis not present

## 2020-10-31 DIAGNOSIS — Z23 Encounter for immunization: Secondary | ICD-10-CM | POA: Diagnosis not present

## 2020-10-31 DIAGNOSIS — Z0000 Encounter for general adult medical examination without abnormal findings: Secondary | ICD-10-CM | POA: Diagnosis not present

## 2020-10-31 DIAGNOSIS — M48062 Spinal stenosis, lumbar region with neurogenic claudication: Secondary | ICD-10-CM | POA: Diagnosis not present

## 2020-10-31 DIAGNOSIS — Z13220 Encounter for screening for lipoid disorders: Secondary | ICD-10-CM | POA: Diagnosis not present

## 2020-10-31 DIAGNOSIS — Z6839 Body mass index (BMI) 39.0-39.9, adult: Secondary | ICD-10-CM | POA: Diagnosis not present

## 2020-10-31 DIAGNOSIS — M4326 Fusion of spine, lumbar region: Secondary | ICD-10-CM | POA: Diagnosis not present

## 2020-10-31 DIAGNOSIS — E1149 Type 2 diabetes mellitus with other diabetic neurological complication: Secondary | ICD-10-CM | POA: Diagnosis not present

## 2020-11-12 DIAGNOSIS — E1149 Type 2 diabetes mellitus with other diabetic neurological complication: Secondary | ICD-10-CM | POA: Diagnosis not present

## 2020-11-12 DIAGNOSIS — R300 Dysuria: Secondary | ICD-10-CM | POA: Diagnosis not present

## 2020-12-03 DIAGNOSIS — M7989 Other specified soft tissue disorders: Secondary | ICD-10-CM | POA: Diagnosis not present

## 2020-12-03 DIAGNOSIS — M79661 Pain in right lower leg: Secondary | ICD-10-CM | POA: Diagnosis not present

## 2020-12-03 DIAGNOSIS — M47819 Spondylosis without myelopathy or radiculopathy, site unspecified: Secondary | ICD-10-CM | POA: Diagnosis not present

## 2020-12-03 DIAGNOSIS — M62830 Muscle spasm of back: Secondary | ICD-10-CM | POA: Diagnosis not present

## 2020-12-14 DIAGNOSIS — M7989 Other specified soft tissue disorders: Secondary | ICD-10-CM | POA: Diagnosis not present

## 2020-12-14 DIAGNOSIS — M79604 Pain in right leg: Secondary | ICD-10-CM | POA: Diagnosis not present

## 2020-12-14 DIAGNOSIS — M79661 Pain in right lower leg: Secondary | ICD-10-CM | POA: Diagnosis not present

## 2020-12-26 DIAGNOSIS — Z20828 Contact with and (suspected) exposure to other viral communicable diseases: Secondary | ICD-10-CM | POA: Diagnosis not present

## 2020-12-26 DIAGNOSIS — R0981 Nasal congestion: Secondary | ICD-10-CM | POA: Diagnosis not present

## 2020-12-26 DIAGNOSIS — R051 Acute cough: Secondary | ICD-10-CM | POA: Diagnosis not present

## 2020-12-26 DIAGNOSIS — R519 Headache, unspecified: Secondary | ICD-10-CM | POA: Diagnosis not present

## 2020-12-30 ENCOUNTER — Telehealth: Payer: Self-pay | Admitting: Nurse Practitioner

## 2020-12-30 NOTE — Telephone Encounter (Signed)
Called to Discuss with patient about Covid symptoms and the use of the monoclonal antibody infusion for those with mild to moderate Covid symptoms and at a high risk of hospitalization.     Pt appears to qualify for this infusion due to co-morbid conditions and/or a member of an at-risk group in accordance with the FDA Emergency Use Authorization.    Patient reports symptoms are getting better. Declines treatment at this time.   Symptom onset: 12/25/2020 Vaccinated: No  Qualified for Infusion: Declined, (dm, bmi cad)  Alda Lea, NP [facility]  [phone]

## 2021-01-01 DIAGNOSIS — J189 Pneumonia, unspecified organism: Secondary | ICD-10-CM | POA: Diagnosis not present

## 2021-01-01 DIAGNOSIS — R051 Acute cough: Secondary | ICD-10-CM | POA: Diagnosis not present

## 2021-01-01 DIAGNOSIS — R509 Fever, unspecified: Secondary | ICD-10-CM | POA: Diagnosis not present

## 2021-01-01 DIAGNOSIS — U071 COVID-19: Secondary | ICD-10-CM | POA: Diagnosis not present

## 2021-01-03 DIAGNOSIS — J329 Chronic sinusitis, unspecified: Secondary | ICD-10-CM | POA: Diagnosis not present

## 2021-01-03 DIAGNOSIS — U071 COVID-19: Secondary | ICD-10-CM | POA: Diagnosis not present

## 2021-01-03 DIAGNOSIS — J40 Bronchitis, not specified as acute or chronic: Secondary | ICD-10-CM | POA: Diagnosis not present

## 2021-01-03 DIAGNOSIS — B3781 Candidal esophagitis: Secondary | ICD-10-CM | POA: Diagnosis not present

## 2021-01-08 ENCOUNTER — Other Ambulatory Visit: Payer: Self-pay

## 2021-01-08 DIAGNOSIS — M7989 Other specified soft tissue disorders: Secondary | ICD-10-CM

## 2021-01-18 DIAGNOSIS — R0981 Nasal congestion: Secondary | ICD-10-CM | POA: Diagnosis not present

## 2021-01-18 DIAGNOSIS — R054 Cough syncope: Secondary | ICD-10-CM | POA: Diagnosis not present

## 2021-01-22 DIAGNOSIS — M4726 Other spondylosis with radiculopathy, lumbar region: Secondary | ICD-10-CM | POA: Diagnosis not present

## 2021-01-22 DIAGNOSIS — M48062 Spinal stenosis, lumbar region with neurogenic claudication: Secondary | ICD-10-CM | POA: Diagnosis not present

## 2021-01-22 DIAGNOSIS — M4326 Fusion of spine, lumbar region: Secondary | ICD-10-CM | POA: Diagnosis not present

## 2021-01-22 DIAGNOSIS — M5116 Intervertebral disc disorders with radiculopathy, lumbar region: Secondary | ICD-10-CM | POA: Diagnosis not present

## 2021-01-23 ENCOUNTER — Encounter: Payer: Self-pay | Admitting: Physician Assistant

## 2021-01-23 ENCOUNTER — Ambulatory Visit (HOSPITAL_COMMUNITY)
Admission: RE | Admit: 2021-01-23 | Discharge: 2021-01-23 | Disposition: A | Discharge status: Home / Self Care | Payer: BC Managed Care – PPO | Source: Ambulatory Visit | Attending: Vascular Surgery | Admitting: Vascular Surgery

## 2021-01-23 ENCOUNTER — Ambulatory Visit (INDEPENDENT_AMBULATORY_CARE_PROVIDER_SITE_OTHER): Payer: BC Managed Care – PPO | Admitting: Physician Assistant

## 2021-01-23 ENCOUNTER — Other Ambulatory Visit: Payer: Self-pay

## 2021-01-23 VITALS — BP 122/59 | HR 76 | Temp 98.4°F | Resp 20 | Ht 65.0 in | Wt 256.8 lb

## 2021-01-23 DIAGNOSIS — M7989 Other specified soft tissue disorders: Secondary | ICD-10-CM | POA: Insufficient documentation

## 2021-01-23 NOTE — Progress Notes (Signed)
[facility]   Reason for referral: Swollen right knee to foot  History of Present Illness  Rachael Sanford is a 55 y.o. female who presents with chief complaint: swollen leg.  Patient notes, onset of swelling >11 months ago, associated with standing or sitting.  The patient has had no history of DVT, + history of varicose vein, no history of venous stasis ulcers, no history of  Lymphedema and no history of skin changes in lower legs.  There is unknwn family history of venous disorders.  The patient has not used compression stockings in the past.  She states she was walking and heard her knee pop.  She has had swelling in the right knee to the foot on/off since then.  She states her knee does not hurt.  She denise symptoms of claudication, non healing wounds or rest pain. She does have DM with history of peripheral neuropathy.    Past Medical History:  Diagnosis Date  . Cancer ([place]) 2009   FACIAL MELANOMA  . Diabetes mellitus    TYPE II  . High cholesterol   . Hypertension   . Seizure ([place])    AS AN INFANT    Past Surgical History:  Procedure Laterality Date  . BREAST CYST EXCISION Left   . melanoma removal  2009   from face   . MOLE REMOVAL    . NASAL SINUS SURGERY  10/2018  . TRIGGER FINGER RELEASE      Social History   Socioeconomic History  . Marital status: Single    Spouse name: Not on file  . Number of children: 0  . Years of education: Not on file  . Highest education level: Not on file  Occupational History  . Not on file  Tobacco Use  . Smoking status: Never Smoker  . Smokeless tobacco: Never Used  Vaping Use  . Vaping Use: Never used  Substance and Sexual Activity  . Alcohol use: Yes    Comment: seldom  . Drug use: No  . Sexual activity: Yes    Birth control/protection: Pill  Other Topics Concern  . Not on file  Social History Narrative  . Not on file   Social Determinants of Health   Financial Resource Strain: Not on  file  Food Insecurity: Not on file  Transportation Needs: Not on file  Physical Activity: Not on file  Stress: Not on file  Social Connections: Not on file  Intimate Partner Violence: Not on file    Family History  Problem Relation Age of Onset  . Breast cancer Mother   . Cancer Mother        LIVER AND GALLBLADDER  . Diabetes Father   . Heart disease Father   . Macular degeneration Father   . Hyperlipidemia Father   . Hypertension Maternal Aunt   . Cancer Paternal Aunt        OVARIAN  . Macular degeneration Paternal Aunt   . Diabetes Paternal Grandmother   . Heart disease Paternal Grandmother   . Cancer Paternal Grandfather        COLON  . Cancer Maternal Uncle        liver and pancreas    Current Outpatient Medications on File Prior to Visit  Medication Sig Dispense Refill  . ACETAMINOPHEN PO Take by mouth.    Marland Kitchen albuterol (VENTOLIN HFA) 108 (90 Base) MCG/ACT inhaler SMARTSIG:1-2 Puff(s) Via Inhaler Every 4 Hours PRN    . amoxicillin-clavulanate (AUGMENTIN) 875-125  MG tablet Take 1 tablet by mouth 2 (two) times daily. 22 tablet 0  . aspirin 81 MG tablet Take 81 mg by mouth daily.    Marland Kitchen b complex vitamins capsule Take 1 capsule by mouth daily.    . benzonatate (TESSALON) 100 MG capsule Take 200 mg by mouth every 8 (eight) hours as needed.    . Biotin 10000 MCG TABS Take by mouth.    Marland Kitchen buPROPion (WELLBUTRIN SR) 150 MG 12 hr tablet TAKE 1 TABLET BY MOUTH TWICE A DAY **INSURANCE WANTS MAIL ORDER**    . CALCIUM-MAGNESIUM-ZINC PO Take by mouth.    Marland Kitchen CINNAMON PO Take by mouth. 1,000mg  to 2,000mg     . ezetimibe (ZETIA) 10 MG tablet Take 10 mg by mouth daily.    Marland Kitchen Fexofenadine HCl (ALLEGRA PO) Take by mouth.    . insulin aspart (NOVOLOG) 100 UNIT/ML injection Inject 35 Units into the skin 3 (three) times daily before meals.    . insulin glargine (LANTUS) 100 UNIT/ML Solostar Pen Inject into the skin.    Marland Kitchen losartan (COZAAR) 100 MG tablet Take 100 mg by mouth daily.    Marland Kitchen MELATONIN  PO Take by mouth.    . metFORMIN (GLUCOPHAGE) 1000 MG tablet Take by mouth.    . methocarbamol (ROBAXIN) 750 MG tablet SMARTSIG:1-1.5 Tablet(s) By Mouth 3 Times Daily PRN    . montelukast (SINGULAIR) 10 MG tablet Take 1 tablet (10 mg total) by mouth at bedtime. 30 tablet 5  . Multiple Vitamin (MULTI-VITAMIN) tablet Take 1 tablet by mouth daily.    . Multiple Vitamins-Minerals (CENTRUM SILVER 50+WOMEN PO) Take by mouth.    . naproxen (NAPROSYN) 500 MG tablet Take 500 mg by mouth 2 (two) times daily as needed.    . pravastatin (PRAVACHOL) 40 MG tablet Take 40 mg by mouth daily.    . Psyllium (METAMUCIL PO) Take by mouth.    Marland Kitchen SALINE NASAL SPRAY NA Place into the nose.    . levonorgestrel-ethinyl estradiol (AVIANE,ALESSE,LESSINA) 0.1-20 MG-MCG tablet Take 1 tablet by mouth daily. 1 Package 11   No current facility-administered medications on file prior to visit.    Allergies as of 01/23/2021 - Review Complete 01/23/2021  Allergen Reaction Noted  . Sulfamethoxazole-trimethoprim Other (See Comments) 09/13/2014  . Seldane [terfenadine] Rash 02/19/2012  . Septra [bactrim] Rash 02/19/2012     ROS:   General:  No weight loss, Fever, chills  HEENT: No recent headaches, no nasal bleeding, no visual changes, no sore throat  Neurologic: No dizziness, blackouts, seizures. No recent symptoms of stroke or mini- stroke. No recent episodes of slurred speech, or temporary blindness.  Cardiac: No recent episodes of chest pain/pressure, no shortness of breath at rest.  No shortness of breath with exertion.  Denies history of atrial fibrillation or irregular heartbeat  Vascular: No history of rest pain in feet.  No history of claudication.  No history of non-healing ulcer, No history of DVT   Pulmonary: No home oxygen, no productive cough, no hemoptysis,  No asthma or wheezing  Musculoskeletal:  [ ]  Arthritis, [x ] Low back pain,  [x ] Joint pain  Hematologic:No history of hypercoagulable state.  No  history of easy bleeding.  No history of anemia  Gastrointestinal: No hematochezia or melena,  No gastroesophageal reflux, no trouble swallowing  Urinary: [ ]  chronic Kidney disease, [ ]  on HD - [ ]  MWF or [ ]  TTHS, [ ]  Burning with urination, [ ]  Frequent urination, [ ]  Difficulty  urinating;   Skin: No rashes  Psychological: No history of anxiety,  No history of depression  Physical Examination  Vitals:   01/23/21 1341  BP: (!) 122/59  Pulse: 76  Resp: 20  Temp: 98.4 F (36.9 C)  TempSrc: Temporal  SpO2: 98%  Weight: 256 lb 12.8 oz (116.5 kg)  Height: 5\' 5"  (1.651 m)    Body mass index is 42.73 kg/m.  General:  Alert and oriented, no acute distress HEENT: Normal Neck: No bruit or JVD Pulmonary: Clear to auscultation bilaterally Cardiac: Regular Rate and Rhythm without murmur Abdomen: Soft, non-tender, non-distended, no mass, no scars Skin: No rash Extremity Pulses:  2+ radial, brachial, femoral, doppler signals dorsalis pedis, posterior tibial, peroneal bilaterally Musculoskeletal: No deformity or edema, good skin lines in the feet   Neurologic: Upper and lower extremity motor  and symmetric  DATA: Venous Reflux Times  +--------------+---------+------+-----------+------------+--------+  RIGHT     Reflux NoRefluxReflux TimeDiameter cmsComments               Yes                   +--------------+---------+------+-----------+------------+--------+  CFV            yes  >1 second             +--------------+---------+------+-----------+------------+--------+  FV mid    no                         +--------------+---------+------+-----------+------------+--------+  Popliteal   no                         +--------------+---------+------+-----------+------------+--------+  GSV at SFJ        yes  >500 ms   0.73         +--------------+---------+------+-----------+------------+--------+  GSV prox thighno               0.50        +--------------+---------+------+-----------+------------+--------+  GSV mid thigh no               0.48        +--------------+---------+------+-----------+------------+--------+  GSV dist thighno               0.40        +--------------+---------+------+-----------+------------+--------+  GSV at knee  no               0.37        +--------------+---------+------+-----------+------------+--------+  SSV Pop Fossa no               0.20        +--------------+---------+------+-----------+------------+--------+      Summary:  Right:  - No evidence of deep vein thrombosis from the common femoral through the  popliteal veins.  - No evidence of superficial venous thrombosis.  - The common femoral vein is not competent.  - The great and small saphenous veins are competent.  - Mid popliteal artery velocity of 237 cm/s noted correlating with a  50-74% stenosis.    Assessment:  Right knee 'pop" 11 months ago wit on/off edema in the right knee to the foot. Venous reflux study shows minimal deep and GSV SFJ area reflux.  The remainder of the GSV has competent valves without reflux.  There is no DVVT.  She has good doppler signals in B LE, no history of claudication, non healing wounds or rest pain.   Plan:  She was measured for compression  15-20 mmhg for swelling of unknown cause.  I recommended a walking program.  Elevation of LE when at rest.  F/U PRN in the future.  Roxy Horseman PA-C [facility] Office: [phone]  MD in clinic [place]

## 2021-03-24 DIAGNOSIS — J0100 Acute maxillary sinusitis, unspecified: Secondary | ICD-10-CM | POA: Diagnosis not present

## 2021-05-19 DIAGNOSIS — M549 Dorsalgia, unspecified: Secondary | ICD-10-CM | POA: Diagnosis not present

## 2021-05-19 DIAGNOSIS — M5431 Sciatica, right side: Secondary | ICD-10-CM | POA: Diagnosis not present

## 2021-05-21 DIAGNOSIS — M4326 Fusion of spine, lumbar region: Secondary | ICD-10-CM | POA: Diagnosis not present

## 2021-05-21 DIAGNOSIS — M4726 Other spondylosis with radiculopathy, lumbar region: Secondary | ICD-10-CM | POA: Diagnosis not present

## 2021-05-21 DIAGNOSIS — M48062 Spinal stenosis, lumbar region with neurogenic claudication: Secondary | ICD-10-CM | POA: Diagnosis not present

## 2021-05-21 DIAGNOSIS — M5431 Sciatica, right side: Secondary | ICD-10-CM | POA: Diagnosis not present

## 2021-05-29 DIAGNOSIS — M25551 Pain in right hip: Secondary | ICD-10-CM | POA: Diagnosis not present

## 2021-05-29 DIAGNOSIS — M545 Low back pain, unspecified: Secondary | ICD-10-CM | POA: Diagnosis not present

## 2021-06-26 DIAGNOSIS — K644 Residual hemorrhoidal skin tags: Secondary | ICD-10-CM | POA: Diagnosis not present

## 2021-06-26 DIAGNOSIS — K219 Gastro-esophageal reflux disease without esophagitis: Secondary | ICD-10-CM | POA: Diagnosis not present

## 2021-07-02 DIAGNOSIS — M7918 Myalgia, other site: Secondary | ICD-10-CM | POA: Diagnosis not present

## 2021-07-02 DIAGNOSIS — M48062 Spinal stenosis, lumbar region with neurogenic claudication: Secondary | ICD-10-CM | POA: Diagnosis not present

## 2021-07-02 DIAGNOSIS — M4326 Fusion of spine, lumbar region: Secondary | ICD-10-CM | POA: Diagnosis not present

## 2021-07-09 DIAGNOSIS — Z6839 Body mass index (BMI) 39.0-39.9, adult: Secondary | ICD-10-CM | POA: Diagnosis not present

## 2021-07-09 DIAGNOSIS — M545 Low back pain, unspecified: Secondary | ICD-10-CM | POA: Diagnosis not present

## 2021-08-08 DIAGNOSIS — M25551 Pain in right hip: Secondary | ICD-10-CM | POA: Diagnosis not present

## 2021-08-10 DIAGNOSIS — R519 Headache, unspecified: Secondary | ICD-10-CM | POA: Diagnosis not present

## 2021-08-10 DIAGNOSIS — M7010 Bursitis, unspecified hand: Secondary | ICD-10-CM | POA: Diagnosis not present

## 2021-08-13 ENCOUNTER — Other Ambulatory Visit: Payer: Self-pay | Admitting: Family Medicine

## 2021-08-13 DIAGNOSIS — Z1231 Encounter for screening mammogram for malignant neoplasm of breast: Secondary | ICD-10-CM

## 2021-09-09 DIAGNOSIS — K219 Gastro-esophageal reflux disease without esophagitis: Secondary | ICD-10-CM | POA: Diagnosis not present

## 2021-09-09 DIAGNOSIS — K644 Residual hemorrhoidal skin tags: Secondary | ICD-10-CM | POA: Diagnosis not present

## 2021-09-16 DIAGNOSIS — M4800 Spinal stenosis, site unspecified: Secondary | ICD-10-CM | POA: Diagnosis not present

## 2021-09-16 DIAGNOSIS — M7062 Trochanteric bursitis, left hip: Secondary | ICD-10-CM | POA: Diagnosis not present

## 2021-09-16 DIAGNOSIS — M47819 Spondylosis without myelopathy or radiculopathy, site unspecified: Secondary | ICD-10-CM | POA: Diagnosis not present

## 2021-09-16 DIAGNOSIS — G5702 Lesion of sciatic nerve, left lower limb: Secondary | ICD-10-CM | POA: Diagnosis not present

## 2021-09-19 DIAGNOSIS — M7061 Trochanteric bursitis, right hip: Secondary | ICD-10-CM | POA: Diagnosis not present

## 2021-09-19 DIAGNOSIS — M4326 Fusion of spine, lumbar region: Secondary | ICD-10-CM | POA: Diagnosis not present

## 2021-10-01 ENCOUNTER — Ambulatory Visit: Payer: BC Managed Care – PPO

## 2021-10-01 DIAGNOSIS — L821 Other seborrheic keratosis: Secondary | ICD-10-CM | POA: Diagnosis not present

## 2021-10-01 DIAGNOSIS — Z85820 Personal history of malignant melanoma of skin: Secondary | ICD-10-CM | POA: Diagnosis not present

## 2021-10-01 DIAGNOSIS — D2239 Melanocytic nevi of other parts of face: Secondary | ICD-10-CM | POA: Diagnosis not present

## 2021-10-01 DIAGNOSIS — D225 Melanocytic nevi of trunk: Secondary | ICD-10-CM | POA: Diagnosis not present

## 2021-10-03 DIAGNOSIS — M216X2 Other acquired deformities of left foot: Secondary | ICD-10-CM

## 2021-10-03 DIAGNOSIS — L84 Corns and callosities: Secondary | ICD-10-CM | POA: Diagnosis not present

## 2021-10-03 DIAGNOSIS — E1142 Type 2 diabetes mellitus with diabetic polyneuropathy: Secondary | ICD-10-CM | POA: Diagnosis not present

## 2021-10-03 DIAGNOSIS — L608 Other nail disorders: Secondary | ICD-10-CM | POA: Diagnosis not present

## 2021-10-03 HISTORY — DX: Other acquired deformities of left foot: M21.6X2

## 2021-10-07 DIAGNOSIS — M549 Dorsalgia, unspecified: Secondary | ICD-10-CM | POA: Diagnosis not present

## 2021-10-11 DIAGNOSIS — K644 Residual hemorrhoidal skin tags: Secondary | ICD-10-CM | POA: Diagnosis not present

## 2021-10-11 DIAGNOSIS — Z09 Encounter for follow-up examination after completed treatment for conditions other than malignant neoplasm: Secondary | ICD-10-CM | POA: Diagnosis not present

## 2021-10-11 DIAGNOSIS — E119 Type 2 diabetes mellitus without complications: Secondary | ICD-10-CM | POA: Diagnosis not present

## 2021-10-11 DIAGNOSIS — Z1211 Encounter for screening for malignant neoplasm of colon: Secondary | ICD-10-CM | POA: Diagnosis not present

## 2021-10-11 DIAGNOSIS — Z86010 Personal history of colonic polyps: Secondary | ICD-10-CM | POA: Diagnosis not present

## 2021-10-22 DIAGNOSIS — M7062 Trochanteric bursitis, left hip: Secondary | ICD-10-CM | POA: Diagnosis not present

## 2021-10-22 DIAGNOSIS — G5702 Lesion of sciatic nerve, left lower limb: Secondary | ICD-10-CM | POA: Diagnosis not present

## 2021-10-22 DIAGNOSIS — M4800 Spinal stenosis, site unspecified: Secondary | ICD-10-CM | POA: Diagnosis not present

## 2021-10-22 DIAGNOSIS — M47819 Spondylosis without myelopathy or radiculopathy, site unspecified: Secondary | ICD-10-CM | POA: Diagnosis not present

## 2021-11-01 ENCOUNTER — Ambulatory Visit
Admission: RE | Admit: 2021-11-01 | Discharge: 2021-11-01 | Disposition: A | Discharge status: Home / Self Care | Payer: BC Managed Care – PPO | Source: Ambulatory Visit | Attending: Family Medicine | Admitting: Family Medicine

## 2021-11-01 ENCOUNTER — Other Ambulatory Visit: Payer: Self-pay

## 2021-11-01 DIAGNOSIS — Z1231 Encounter for screening mammogram for malignant neoplasm of breast: Secondary | ICD-10-CM

## 2021-11-01 DIAGNOSIS — Z23 Encounter for immunization: Secondary | ICD-10-CM | POA: Diagnosis not present

## 2021-11-01 DIAGNOSIS — E1149 Type 2 diabetes mellitus with other diabetic neurological complication: Secondary | ICD-10-CM | POA: Diagnosis not present

## 2021-11-01 DIAGNOSIS — I10 Essential (primary) hypertension: Secondary | ICD-10-CM | POA: Diagnosis not present

## 2021-11-01 DIAGNOSIS — Z0000 Encounter for general adult medical examination without abnormal findings: Secondary | ICD-10-CM | POA: Diagnosis not present

## 2021-11-01 DIAGNOSIS — Z6841 Body Mass Index (BMI) 40.0 and over, adult: Secondary | ICD-10-CM | POA: Diagnosis not present

## 2021-11-06 DIAGNOSIS — E119 Type 2 diabetes mellitus without complications: Secondary | ICD-10-CM | POA: Diagnosis not present

## 2021-11-19 DIAGNOSIS — R739 Hyperglycemia, unspecified: Secondary | ICD-10-CM | POA: Diagnosis not present

## 2021-11-19 DIAGNOSIS — M25519 Pain in unspecified shoulder: Secondary | ICD-10-CM | POA: Diagnosis not present

## 2021-11-19 DIAGNOSIS — S0101XA Laceration without foreign body of scalp, initial encounter: Secondary | ICD-10-CM | POA: Diagnosis not present

## 2021-11-19 DIAGNOSIS — W010XXA Fall on same level from slipping, tripping and stumbling without subsequent striking against object, initial encounter: Secondary | ICD-10-CM | POA: Diagnosis not present

## 2021-11-19 DIAGNOSIS — Z9889 Other specified postprocedural states: Secondary | ICD-10-CM | POA: Diagnosis not present

## 2021-11-19 DIAGNOSIS — J323 Chronic sphenoidal sinusitis: Secondary | ICD-10-CM | POA: Diagnosis not present

## 2021-11-19 DIAGNOSIS — J320 Chronic maxillary sinusitis: Secondary | ICD-10-CM | POA: Diagnosis not present

## 2021-11-19 DIAGNOSIS — R220 Localized swelling, mass and lump, head: Secondary | ICD-10-CM | POA: Diagnosis not present

## 2021-11-19 DIAGNOSIS — M25511 Pain in right shoulder: Secondary | ICD-10-CM | POA: Diagnosis not present

## 2021-11-19 DIAGNOSIS — W19XXXA Unspecified fall, initial encounter: Secondary | ICD-10-CM | POA: Diagnosis not present

## 2021-11-19 DIAGNOSIS — M7061 Trochanteric bursitis, right hip: Secondary | ICD-10-CM | POA: Diagnosis not present

## 2021-11-20 DIAGNOSIS — R220 Localized swelling, mass and lump, head: Secondary | ICD-10-CM | POA: Diagnosis not present

## 2021-11-20 DIAGNOSIS — Z9889 Other specified postprocedural states: Secondary | ICD-10-CM | POA: Diagnosis not present

## 2021-11-20 DIAGNOSIS — J323 Chronic sphenoidal sinusitis: Secondary | ICD-10-CM | POA: Diagnosis not present

## 2021-11-20 DIAGNOSIS — J320 Chronic maxillary sinusitis: Secondary | ICD-10-CM | POA: Diagnosis not present

## 2021-11-20 DIAGNOSIS — M25511 Pain in right shoulder: Secondary | ICD-10-CM | POA: Diagnosis not present

## 2021-11-26 DIAGNOSIS — S0101XA Laceration without foreign body of scalp, initial encounter: Secondary | ICD-10-CM | POA: Diagnosis not present

## 2021-11-26 DIAGNOSIS — Z1331 Encounter for screening for depression: Secondary | ICD-10-CM | POA: Diagnosis not present

## 2022-01-09 DIAGNOSIS — M549 Dorsalgia, unspecified: Secondary | ICD-10-CM | POA: Diagnosis not present

## 2022-01-10 DIAGNOSIS — E1149 Type 2 diabetes mellitus with other diabetic neurological complication: Secondary | ICD-10-CM | POA: Diagnosis not present

## 2022-01-10 DIAGNOSIS — E785 Hyperlipidemia, unspecified: Secondary | ICD-10-CM | POA: Diagnosis not present

## 2022-01-10 DIAGNOSIS — I10 Essential (primary) hypertension: Secondary | ICD-10-CM | POA: Diagnosis not present

## 2022-01-10 DIAGNOSIS — R0609 Other forms of dyspnea: Secondary | ICD-10-CM | POA: Diagnosis not present

## 2022-01-10 DIAGNOSIS — M7989 Other specified soft tissue disorders: Secondary | ICD-10-CM | POA: Diagnosis not present

## 2022-01-10 DIAGNOSIS — R300 Dysuria: Secondary | ICD-10-CM | POA: Diagnosis not present

## 2022-01-29 DIAGNOSIS — L03116 Cellulitis of left lower limb: Secondary | ICD-10-CM | POA: Diagnosis not present

## 2022-01-30 DIAGNOSIS — M7989 Other specified soft tissue disorders: Secondary | ICD-10-CM | POA: Diagnosis not present

## 2022-01-31 DIAGNOSIS — M216X2 Other acquired deformities of left foot: Secondary | ICD-10-CM | POA: Diagnosis not present

## 2022-01-31 DIAGNOSIS — L97521 Non-pressure chronic ulcer of other part of left foot limited to breakdown of skin: Secondary | ICD-10-CM

## 2022-01-31 DIAGNOSIS — E1142 Type 2 diabetes mellitus with diabetic polyneuropathy: Secondary | ICD-10-CM | POA: Diagnosis not present

## 2022-01-31 DIAGNOSIS — E11621 Type 2 diabetes mellitus with foot ulcer: Secondary | ICD-10-CM | POA: Diagnosis not present

## 2022-01-31 DIAGNOSIS — L84 Corns and callosities: Secondary | ICD-10-CM | POA: Diagnosis not present

## 2022-01-31 HISTORY — DX: Non-pressure chronic ulcer of other part of left foot limited to breakdown of skin: L97.521

## 2022-02-06 DIAGNOSIS — E1142 Type 2 diabetes mellitus with diabetic polyneuropathy: Secondary | ICD-10-CM | POA: Diagnosis not present

## 2022-02-06 DIAGNOSIS — L97521 Non-pressure chronic ulcer of other part of left foot limited to breakdown of skin: Secondary | ICD-10-CM | POA: Diagnosis not present

## 2022-02-06 DIAGNOSIS — E11621 Type 2 diabetes mellitus with foot ulcer: Secondary | ICD-10-CM | POA: Diagnosis not present

## 2022-02-06 DIAGNOSIS — M216X2 Other acquired deformities of left foot: Secondary | ICD-10-CM | POA: Diagnosis not present

## 2022-03-18 DIAGNOSIS — M216X2 Other acquired deformities of left foot: Secondary | ICD-10-CM | POA: Diagnosis not present

## 2022-03-18 DIAGNOSIS — E1142 Type 2 diabetes mellitus with diabetic polyneuropathy: Secondary | ICD-10-CM | POA: Diagnosis not present

## 2022-03-18 DIAGNOSIS — L97521 Non-pressure chronic ulcer of other part of left foot limited to breakdown of skin: Secondary | ICD-10-CM | POA: Diagnosis not present

## 2022-03-21 DIAGNOSIS — R0609 Other forms of dyspnea: Secondary | ICD-10-CM | POA: Diagnosis not present

## 2022-03-21 DIAGNOSIS — I361 Nonrheumatic tricuspid (valve) insufficiency: Secondary | ICD-10-CM

## 2022-04-20 DIAGNOSIS — R0981 Nasal congestion: Secondary | ICD-10-CM | POA: Diagnosis not present

## 2022-04-20 DIAGNOSIS — J324 Chronic pansinusitis: Secondary | ICD-10-CM | POA: Diagnosis not present

## 2022-04-20 DIAGNOSIS — R051 Acute cough: Secondary | ICD-10-CM | POA: Diagnosis not present

## 2022-04-24 ENCOUNTER — Encounter: Payer: Self-pay | Admitting: Cardiology

## 2022-04-24 ENCOUNTER — Ambulatory Visit: Payer: BC Managed Care – PPO | Admitting: Cardiology

## 2022-04-24 VITALS — BP 134/60 | HR 69 | Ht 65.0 in | Wt 277.0 lb

## 2022-04-24 DIAGNOSIS — R0609 Other forms of dyspnea: Secondary | ICD-10-CM

## 2022-04-24 DIAGNOSIS — I272 Pulmonary hypertension, unspecified: Secondary | ICD-10-CM

## 2022-04-24 DIAGNOSIS — E663 Overweight: Secondary | ICD-10-CM

## 2022-04-24 DIAGNOSIS — Z794 Long term (current) use of insulin: Secondary | ICD-10-CM

## 2022-04-24 DIAGNOSIS — E119 Type 2 diabetes mellitus without complications: Secondary | ICD-10-CM

## 2022-04-24 DIAGNOSIS — R0683 Snoring: Secondary | ICD-10-CM

## 2022-04-24 HISTORY — DX: Pulmonary hypertension, unspecified: I27.20

## 2022-04-24 NOTE — Progress Notes (Unsigned)
Cardiology Consultation:    Date:  04/24/2022   ID:  Sherie Don, DOB 06/08/1966, MRN [id number]  PCP:  Street, Sharon Mt, MD  Cardiologist:  Jenne Campus, MD   Referring MD: Street, Sharon Mt, *   Chief Complaint  Patient presents with   Establish Care   Abnormal Echo    History of Present Illness:    Rachael Sanford is a 56 y.o. female who is being seen today for the evaluation of abnormal echocardiogram with pulmonary hypertension at the request of Street, Sharon Mt, *.  I did see her more than 3 years ago she does have a past medical history significant for morbid obesity, essential hypertension, diabetes, dyslipidemia, polycystic ovarian.  Last time I seen her she is supposed to have an echocardiogram done as well as stress test however it did not happen.  Within the last few months she started experiencing swelling of lower extremities.  Her primary care physicians were appropriately ordered an echocardiogram that revealed elevation of the pulm artery pressure.  Left ventricle ejection fraction was normal.  She was sent for [place] to evaluate this issue.  She described to have some fatigue tiredness shortness of breath.  He she cannot tell me if she snores a lot at night.  Denies have any tenderness of calf pain.  Denies have any chest pain tightness squeezing pressure burning chest.  Past Medical History:  Diagnosis Date   Cancer ([place]) 2009   FACIAL MELANOMA   Diabetes mellitus    TYPE II   High cholesterol    Hypertension    Seizure ([place])    AS AN INFANT    Past Surgical History:  Procedure Laterality Date   BREAST CYST EXCISION Left    melanoma removal  2009   from face    [place]  10/2018   TRIGGER FINGER RELEASE      Current Medications: Current Meds  Medication Sig   acetaminophen (TYLENOL) 500 MG tablet Take 500 mg by mouth every 8 (eight) hours as needed for mild pain or moderate pain.   aspirin 81 MG tablet Take  81 mg by mouth daily.   b complex vitamins capsule Take 1 capsule by mouth daily.   Biotin 10000 MCG TABS Take 1 tablet by mouth daily.   CALCIUM-MAGNESIUM-ZINC PO Take 1 tablet by mouth daily.   CINNAMON PO Take by mouth. 1,'000mg'$  to 2,'000mg'$    co-enzyme Q-10 30 MG capsule Take 1 capsule by mouth daily.   diclofenac Sodium (VOLTAREN) 1 % GEL Apply 1 application. topically daily as needed for pain.   doxycycline (VIBRA-TABS) 100 MG tablet Take 100 mg by mouth 2 (two) times daily.   ezetimibe (ZETIA) 10 MG tablet Take 10 mg by mouth daily.   fexofenadine (ALLEGRA) 180 MG tablet Take 180 mg by mouth daily as needed for allergies or rhinitis.   fluticasone (FLONASE) 50 MCG/ACT nasal spray Place 2 sprays into both nostrils daily.   furosemide (LASIX) 20 MG tablet Take 0.5-1 tablets by mouth daily as needed for edema.   HUMALOG KWIKPEN 100 UNIT/ML KwikPen Inject 35 Units into the skin 3 (three) times daily.   Insulin Glargine (BASAGLAR KWIKPEN) 100 UNIT/ML Inject 75 Units into the skin 2 (two) times daily.   KLOR-CON M20 20 MEQ tablet Take 20 mEq by mouth daily.   losartan (COZAAR) 100 MG tablet Take 100 mg by mouth daily.   metFORMIN (GLUCOPHAGE) 1000 MG tablet  Take 1,000 mg by mouth 2 (two) times daily.   methocarbamol (ROBAXIN) 750 MG tablet SMARTSIG:1-1.5 Tablet(s) By Mouth 3 Times Daily PRN   Multiple Vitamins-Minerals (CENTRUM SILVER 50+WOMEN PO) Take 1 tablet by mouth daily.   pravastatin (PRAVACHOL) 40 MG tablet Take 40 mg by mouth daily.   Psyllium (METAMUCIL PO) Take 1 Dose by mouth daily.     Allergies:   Sulfamethoxazole-trimethoprim, Seldane [terfenadine], and Septra [bactrim]   Social History   Socioeconomic History   Marital status: Single    Spouse name: Not on file   Number of children: 0   Years of education: Not on file   Highest education level: Not on file  Occupational History   Not on file  Tobacco Use   Smoking status: Never   Smokeless tobacco: Never  Vaping  Use   Vaping Use: Never used  Substance and Sexual Activity   Alcohol use: Yes    Comment: seldom   Drug use: No   Sexual activity: Yes    Birth control/protection: Pill  Other Topics Concern   Not on file  Social History Narrative   Not on file   Social Determinants of Health   Financial Resource Strain: Not on file  Food Insecurity: Not on file  Transportation Needs: Not on file  Physical Activity: Not on file  Stress: Not on file  Social Connections: Not on file     Family History: The patient's family history includes Breast cancer in her mother; Cancer in her maternal uncle, mother, paternal aunt, and paternal grandfather; Diabetes in her father and paternal grandmother; Heart disease in her father and paternal grandmother; Hyperlipidemia in her father; Hypertension in her maternal aunt; Macular degeneration in her father and paternal aunt. ROS:   Please see the history of present illness.    All 14 point review of systems negative except as described per history of present illness.  EKGs/Labs/Other Studies Reviewed:    The following studies were reviewed today: Echocardiogram was technically difficult but showed preserved left ventricle ejection fraction, pseudonormal left ventricular filling consistent with elevated left atrial pressure, right ventricle pressure was 52 mmHg   EKG:  EKG is  ordered today.  The ekg ordered today demonstrates normal sinus rhythm left anterior hemiblock, no ST segment changes  Recent Labs: No results found for requested labs within last 8760 hours.  Recent Lipid Panel No results found for: CHOL, TRIG, HDL, CHOLHDL, VLDL, LDLCALC, LDLDIRECT  Physical Exam:    VS:  BP 134/60 (BP Location: Right Arm, Patient Position: Sitting, Cuff Size: Normal)   Pulse 69   Ht '5\' 5"'$  (1.651 m)   Wt 277 lb (125.6 kg)   LMP 08/31/2017   SpO2 94%   BMI 46.10 kg/m     Wt Readings from Last 3 Encounters:  04/24/22 277 lb (125.6 kg)  01/23/21 256 lb  12.8 oz (116.5 kg)  10/24/19 267 lb (121.1 kg)     GEN:  Well nourished, well developed in no acute distress HEENT: Normal NECK: No JVD; No carotid bruits LYMPHATICS: No lymphadenopathy CARDIAC: RRR, no murmurs, no rubs, no gallops RESPIRATORY:  Clear to auscultation without rales, wheezing or rhonchi  ABDOMEN: Soft, non-tender, non-distended MUSCULOSKELETAL:  No edema; No deformity  SKIN: Warm and dry NEUROLOGIC:  Alert and oriented x 3 PSYCHIATRIC:  Normal affect   ASSESSMENT:    1. Type 2 diabetes mellitus without complication, with long-term current use of insulin ([place])   2. Pulmonary hypertension, unspecified ([place])  3. Dyspnea on exertion   4. Overweight    PLAN:    In order of problems listed above:  Pulmonary hypertension.  I suspect this is related to morbid obesity potentially sleep apnea however she also does have diastolic dysfunction therefore it is a group 2 or 3.  I will ask her to have VQ scan to rule out possibility of thromboembolic events prior.  She is taking diuretics right now which I will continue at the present dose.  Her symptoms seems to be controlled.  I will check proBNP as well as Chem-7.  She may require more persistent will be more aggressive diuresis.  She also will be scheduled to have a home sleep study to check for obstructive sleep apnea.  She does have excessive daytime somnolence, she does have pulmonary hypertension she is morbidly obese.  Therefore, she is at high risk of having obstructive sleep apnea that need to be assessed and properly manage Type 2 diabetes.  Poorly controlled.  I did review her K PN which show me her hemoglobin A1c of 8.6 this is from January 10, 2022 Dyspnea on exertion multifactorial again she does have diastolic dysfunction as well as pulmonary hypertension. Overweight clearly significant problem need to be better controlled. Dyslipidemia I do have her data from December with total cholesterol of 147 HDL  73.   Medication Adjustments/Labs and Tests Ordered: Current medicines are reviewed at length with the patient today.  Concerns regarding medicines are outlined above.  No orders of the defined types were placed in this encounter.  No orders of the defined types were placed in this encounter.   Signed, Park Liter, MD, [facility]. 04/24/2022 9:15 AM    [place]

## 2022-04-24 NOTE — Patient Instructions (Signed)
Medication Instructions:  Your physician recommends that you continue on your current medications as directed. Please refer to the Current Medication list given to you today.  *If you need a refill on your cardiac medications before your next appointment, please call your pharmacy*   Lab Work: CMP, ProBNP Today If you have labs (blood work) drawn today and your tests are completely normal, you will receive your results only by: [place] (if you have MyChart) OR A paper copy in the mail If you have any lab test that is abnormal or we need to change your treatment, we will call you to review the results.   Testing/Procedures: VQ Scan at [facility] to rule out pulmonary Embolism Chest X-ray Itamar- In home sleep study- Will send to insurance to pre cert and then will call to come pick up device.    Follow-Up: At [facility], you and your health needs are our priority.  As part of our continuing mission to provide you with exceptional heart care, we have created designated Provider Care Teams.  These Care Teams include your primary Cardiologist (physician) and Advanced Practice Providers (APPs -  Physician Assistants and Nurse Practitioners) who all work together to provide you with the care you need, when you need it.  We recommend signing up for the patient portal called "MyChart".  Sign up information is provided on this After Visit Summary.  MyChart is used to connect with patients for Virtual Visits (Telemedicine).  Patients are able to view lab/test results, encounter notes, upcoming appointments, etc.  Non-urgent messages can be sent to your provider as well.   To learn more about what you can do with MyChart, go to [email or web address].    Your next appointment:   2 month(s)  The format for your next appointment:   In Person  Provider:   Jenne Campus, MD    Other Instructions NA

## 2022-04-25 LAB — COMPREHENSIVE METABOLIC PANEL
ALT: 25 IU/L (ref 0–32)
AST: 20 IU/L (ref 0–40)
Albumin/Globulin Ratio: 2 (ref 1.2–2.2)
Albumin: 4.1 g/dL (ref 3.8–4.9)
Alkaline Phosphatase: 70 IU/L (ref 44–121)
BUN/Creatinine Ratio: 23 (ref 9–23)
BUN: 16 mg/dL (ref 6–24)
Bilirubin Total: 0.2 mg/dL (ref 0.0–1.2)
CO2: 24 mmol/L (ref 20–29)
Calcium: 10.1 mg/dL (ref 8.7–10.2)
Chloride: 102 mmol/L (ref 96–106)
Creatinine, Ser: 0.7 mg/dL (ref 0.57–1.00)
Globulin, Total: 2.1 g/dL (ref 1.5–4.5)
Glucose: 207 mg/dL — ABNORMAL HIGH (ref 70–99)
Potassium: 5.1 mmol/L (ref 3.5–5.2)
Sodium: 139 mmol/L (ref 134–144)
Total Protein: 6.2 g/dL (ref 6.0–8.5)
eGFR: 101 mL/min/{1.73_m2} (ref >59)

## 2022-04-25 LAB — PRO B NATRIURETIC PEPTIDE: NT-Pro BNP: 88 pg/mL (ref 0–287)

## 2022-04-28 ENCOUNTER — Other Ambulatory Visit: Payer: Self-pay

## 2022-04-28 DIAGNOSIS — R079 Chest pain, unspecified: Secondary | ICD-10-CM

## 2022-04-29 ENCOUNTER — Telehealth: Payer: Self-pay

## 2022-04-29 NOTE — Telephone Encounter (Signed)
LVM for pt per [facility] regarding normal lab results. Encouraged her to call with any questions or concerns.

## 2022-04-30 DIAGNOSIS — M545 Low back pain, unspecified: Secondary | ICD-10-CM | POA: Diagnosis not present

## 2022-05-05 ENCOUNTER — Telehealth: Payer: Self-pay | Admitting: *Deleted

## 2022-05-05 NOTE — Telephone Encounter (Signed)
Per [employer] no PA is required for itamar. Jacobo Forest, RN notified ok to activate device. [employer] call reference # [id number].

## 2022-05-06 ENCOUNTER — Telehealth: Payer: Self-pay | Admitting: Cardiology

## 2022-05-06 ENCOUNTER — Telehealth: Payer: Self-pay | Admitting: *Deleted

## 2022-05-06 DIAGNOSIS — L84 Corns and callosities: Secondary | ICD-10-CM | POA: Diagnosis not present

## 2022-05-06 DIAGNOSIS — B351 Tinea unguium: Secondary | ICD-10-CM | POA: Diagnosis not present

## 2022-05-06 DIAGNOSIS — Z794 Long term (current) use of insulin: Secondary | ICD-10-CM | POA: Diagnosis not present

## 2022-05-06 DIAGNOSIS — R0989 Other specified symptoms and signs involving the circulatory and respiratory systems: Secondary | ICD-10-CM | POA: Diagnosis not present

## 2022-05-06 DIAGNOSIS — M7989 Other specified soft tissue disorders: Secondary | ICD-10-CM | POA: Diagnosis not present

## 2022-05-06 DIAGNOSIS — J989 Respiratory disorder, unspecified: Secondary | ICD-10-CM | POA: Diagnosis not present

## 2022-05-06 DIAGNOSIS — E1142 Type 2 diabetes mellitus with diabetic polyneuropathy: Secondary | ICD-10-CM | POA: Diagnosis not present

## 2022-05-06 NOTE — Telephone Encounter (Signed)
Jacobo Forest, RN notified via staff message per [facility] no PA is required for itamar. Ok to activate the device.

## 2022-05-06 NOTE — Telephone Encounter (Signed)
Calling to f/u and make sure that Chest Xray that was sent Via fax computer was received regarding pt. She would like a call back to confirm. Please advise

## 2022-05-07 DIAGNOSIS — Z8631 Personal history of diabetic foot ulcer: Secondary | ICD-10-CM

## 2022-05-07 DIAGNOSIS — B351 Tinea unguium: Secondary | ICD-10-CM | POA: Insufficient documentation

## 2022-05-07 HISTORY — DX: Tinea unguium: B35.1

## 2022-05-07 HISTORY — DX: Personal history of diabetic foot ulcer: Z86.31

## 2022-05-12 ENCOUNTER — Telehealth: Payer: Self-pay

## 2022-05-12 NOTE — Telephone Encounter (Signed)
LVM per DPR regarding test results at [facility]. Will call pt back with results when Dr. Agustin Cree reviews the tests.

## 2022-05-12 NOTE — Telephone Encounter (Signed)
Follow Up:     Patient is calling about the 2 test she had last week at [facility]. She would the results please.

## 2022-05-20 ENCOUNTER — Telehealth: Payer: Self-pay

## 2022-05-20 NOTE — Telephone Encounter (Signed)
Called pt for test results and to come get Itamar sleep study device. VM full. Unable to leave VM.

## 2022-05-20 NOTE — Telephone Encounter (Signed)
Spoke with pt regarding test results and advised she could pick up Itamar sleep Study.Pt had no questions or concerns.

## 2022-05-21 ENCOUNTER — Other Ambulatory Visit: Payer: Self-pay | Admitting: Family Medicine

## 2022-05-21 DIAGNOSIS — Z1231 Encounter for screening mammogram for malignant neoplasm of breast: Secondary | ICD-10-CM

## 2022-05-26 NOTE — Telephone Encounter (Signed)
Patient called stating she does not have access to internet.  Please advise.

## 2022-05-26 NOTE — Telephone Encounter (Addendum)
Spoke with pt. She picked up Itamar sleep study today. She does not have internet. Encouraged her to call customer service to see if she can use Cell data. Pt stated that she would call. Encouraged her to bring the device back if she is unable to connect and use it.

## 2022-05-30 DIAGNOSIS — R051 Acute cough: Secondary | ICD-10-CM | POA: Diagnosis not present

## 2022-05-30 DIAGNOSIS — J069 Acute upper respiratory infection, unspecified: Secondary | ICD-10-CM | POA: Diagnosis not present

## 2022-06-06 ENCOUNTER — Ambulatory Visit: Payer: BC Managed Care – PPO

## 2022-06-06 DIAGNOSIS — R0609 Other forms of dyspnea: Secondary | ICD-10-CM

## 2022-06-06 DIAGNOSIS — E663 Overweight: Secondary | ICD-10-CM

## 2022-06-06 DIAGNOSIS — I272 Pulmonary hypertension, unspecified: Secondary | ICD-10-CM

## 2022-06-06 DIAGNOSIS — R0683 Snoring: Secondary | ICD-10-CM

## 2022-06-06 DIAGNOSIS — Z794 Long term (current) use of insulin: Secondary | ICD-10-CM

## 2022-06-08 ENCOUNTER — Encounter (INDEPENDENT_AMBULATORY_CARE_PROVIDER_SITE_OTHER): Payer: BC Managed Care – PPO | Admitting: Cardiology

## 2022-06-08 DIAGNOSIS — G4733 Obstructive sleep apnea (adult) (pediatric): Secondary | ICD-10-CM | POA: Diagnosis not present

## 2022-06-08 DIAGNOSIS — G4734 Idiopathic sleep related nonobstructive alveolar hypoventilation: Secondary | ICD-10-CM

## 2022-06-09 NOTE — Procedures (Signed)
   SLEEP STUDY REPORT Patient Information Study Date: 06/08/22 Patient Name: Rachael Sanford Patient ID: [id number] Birth Date: 11-Apr-2066 Age: 56 Gender: Female Referring Physician: Jenne Campus, MD  TEST DESCRIPTION: Home sleep apnea testing was completed using the WatchPat , a Type 1 device, utilizing peripheral arterial tonometry (PAT), chest movement, actigraphy, pulse oximetry, pulse rate, body position and snore. AHI was calculated with apnea and hypopnea using valid sleep time as the denominator. RDI includes apneas, hypopneas, and RERAs. The data acquired and the scoring of sleep and all associated events were performed in accordance with the recommended standards and specifications as outlined in the AASM Manual for the Scoring of Sleep and Associated Events 2.2.0 (2015).  FINDINGS: 1. Severe Obstructive Sleep Apnea with AHI 29.6/hr. 2. No Central Sleep Apnea with pAHIc 1.3/hr. 3. Oxygen desaturations as low as 68%. 4. Severe snoring was present. O2 sats were < 88% for 119.9 min. 5. Total sleep time was 6 hrs and 25 min. 6. 28.3% of total sleep time was spent in REM sleep. 7. Normal sleep onset latency at 17 min. 8. Shortened REM sleep onset latency at 57 min. 9. Total awakenings were 19.  DIAGNOSIS: Severe Obstructive Sleep Apnea (G47.33) Nocturnal Hypoxemia  RECOMMENDATIONS: 1. Clinical correlation of these findings is necessary. The decision to treat obstructive sleep apnea (OSA) is usually based on the presence of apnea symptoms or the presence of associated medical conditions such as Hypertension, Congestive Heart Failure, Atrial Fibrillation or Obesity. The most common symptoms of OSA are snoring, gasping for breath while sleeping, daytime sleepiness and fatigue.  2. Initiating apnea therapy is recommended given the presence of symptoms and/or associated conditions. Recommend proceeding with one of the following:  a. Auto-CPAP therapy with a pressure range of  5-20cm H2O.  b. An oral appliance (OA) that can be obtained from certain dentists with expertise in sleep medicine. These are primarily of use in non-obese patients with mild and moderate disease.  c. An ENT consultation which may be useful to look for specific causes of obstruction and possible treatment options.  d. If patient is intolerant to PAP therapy, consider referral to ENT for evaluation for hypoglossal nerve stimulator .  3. Close follow-up is necessary to ensure success with CPAP or oral appliance therapy for maximum benefit .  4. A follow-up oximetry study on CPAP is recommended to assess the adequacy of therapy and determine the need for supplemental oxygen or the potential need for Bi-level therapy. An arterial blood gas to determine the adequacy of baseline ventilation and oxygenation should also be considered.  5. Healthy sleep recommendations include: adequate nightly sleep (normal 7-9 hrs/night), avoidance of caffeine after noon and alcohol near bedtime, and maintaining a sleep environment that is cool, dark and quiet.  6. Weight loss for overweight patients is recommended. Even modest amounts of weight loss can significantly improve the severity of sleep apnea.  7. Snoring recommendations include: weight loss where appropriate, side sleeping, and avoidance of alcohol before bed.  8. Operation of motor vehicle should be avoided when sleepy.  Signature: Electronically Signed: 06/09/22 Fransico Him, MD; [facility]; [place], American Board of Sleep Medicine

## 2022-06-17 ENCOUNTER — Telehealth: Payer: Self-pay | Admitting: Cardiology

## 2022-06-17 DIAGNOSIS — H25041 Posterior subcapsular polar age-related cataract, right eye: Secondary | ICD-10-CM | POA: Diagnosis not present

## 2022-06-17 NOTE — Telephone Encounter (Signed)
Patient is requesting a call back to discuss sleep study results.

## 2022-06-19 DIAGNOSIS — E785 Hyperlipidemia, unspecified: Secondary | ICD-10-CM | POA: Diagnosis not present

## 2022-06-19 DIAGNOSIS — G63 Polyneuropathy in diseases classified elsewhere: Secondary | ICD-10-CM | POA: Diagnosis not present

## 2022-06-19 DIAGNOSIS — I10 Essential (primary) hypertension: Secondary | ICD-10-CM | POA: Diagnosis not present

## 2022-06-19 DIAGNOSIS — E1149 Type 2 diabetes mellitus with other diabetic neurological complication: Secondary | ICD-10-CM | POA: Diagnosis not present

## 2022-06-19 NOTE — Telephone Encounter (Signed)
Patient called again to get results from the sleep study.

## 2022-06-30 ENCOUNTER — Ambulatory Visit: Payer: BC Managed Care – PPO | Admitting: Cardiology

## 2022-06-30 ENCOUNTER — Telehealth: Payer: Self-pay | Admitting: *Deleted

## 2022-06-30 DIAGNOSIS — G4734 Idiopathic sleep related nonobstructive alveolar hypoventilation: Secondary | ICD-10-CM

## 2022-06-30 DIAGNOSIS — G4733 Obstructive sleep apnea (adult) (pediatric): Secondary | ICD-10-CM

## 2022-06-30 DIAGNOSIS — R0683 Snoring: Secondary | ICD-10-CM

## 2022-06-30 NOTE — Telephone Encounter (Signed)
Return call: Called patient lmtcb and sent a mychasrt message to call our office to get her results.

## 2022-06-30 NOTE — Telephone Encounter (Signed)
-----   Message from Lauralee Evener, [place] sent at 06/24/2022  9:02 AM EDT -----  ----- Message ----- From: Sueanne Margarita, MD Sent: 06/09/2022   1:28 PM EDT To: Cv Div Sleep Studies  Please let patient know that they have sleep apnea.  Recommend therapeutic CPAP titration for treatment of patient's sleep disordered breathing.  If unable to perform an in lab titration then initiate ResMed auto CPAP from 4 to 15cm H2O with heated humidity and mask of choice and overnight pulse ox on CPAP.

## 2022-06-30 NOTE — Telephone Encounter (Signed)
The patient has been notified of the result and verbalized understanding.  All questions (if any) were answered. Marolyn Hammock, [place] 06/30/2022 37:36 PM    Precert titration

## 2022-07-03 NOTE — Telephone Encounter (Signed)
Return call: Called patient lmtcb and sent a mychasrt message to call our office to get her results.

## 2022-07-05 DIAGNOSIS — M549 Dorsalgia, unspecified: Secondary | ICD-10-CM | POA: Diagnosis not present

## 2022-07-09 DIAGNOSIS — M549 Dorsalgia, unspecified: Secondary | ICD-10-CM | POA: Diagnosis not present

## 2022-07-18 NOTE — Telephone Encounter (Signed)
Prior Authorization for TITRATION sent to [facility] via web portal. Tracking Number .  LGS#[id number] -08/21/22--11/20/22 FAX# [id number] 8/11  [phone] FOR PA

## 2022-07-18 NOTE — Telephone Encounter (Signed)
The patient has been notified of the result and verbalized understanding.  All questions (if any) were answered. Marolyn Hammock, [place] 07/18/2022 83:25 AM    WILL PRECERT TITRATION

## 2022-08-08 DIAGNOSIS — B351 Tinea unguium: Secondary | ICD-10-CM | POA: Diagnosis not present

## 2022-08-08 DIAGNOSIS — E1142 Type 2 diabetes mellitus with diabetic polyneuropathy: Secondary | ICD-10-CM | POA: Diagnosis not present

## 2022-08-08 DIAGNOSIS — Z7984 Long term (current) use of oral hypoglycemic drugs: Secondary | ICD-10-CM | POA: Diagnosis not present

## 2022-08-08 DIAGNOSIS — Z794 Long term (current) use of insulin: Secondary | ICD-10-CM | POA: Diagnosis not present

## 2022-08-29 DIAGNOSIS — R1084 Generalized abdominal pain: Secondary | ICD-10-CM | POA: Diagnosis not present

## 2022-08-29 DIAGNOSIS — R519 Headache, unspecified: Secondary | ICD-10-CM | POA: Diagnosis not present

## 2022-08-29 DIAGNOSIS — R0981 Nasal congestion: Secondary | ICD-10-CM | POA: Diagnosis not present

## 2022-09-05 ENCOUNTER — Ambulatory Visit (HOSPITAL_BASED_OUTPATIENT_CLINIC_OR_DEPARTMENT_OTHER): Payer: BC Managed Care – PPO | Attending: Cardiology | Admitting: Cardiology

## 2022-09-05 VITALS — Ht 66.0 in | Wt 277.0 lb

## 2022-09-05 DIAGNOSIS — R0683 Snoring: Secondary | ICD-10-CM | POA: Diagnosis not present

## 2022-09-05 DIAGNOSIS — G4733 Obstructive sleep apnea (adult) (pediatric): Secondary | ICD-10-CM

## 2022-09-05 DIAGNOSIS — G4734 Idiopathic sleep related nonobstructive alveolar hypoventilation: Secondary | ICD-10-CM | POA: Diagnosis not present

## 2022-09-08 DIAGNOSIS — H25813 Combined forms of age-related cataract, bilateral: Secondary | ICD-10-CM | POA: Diagnosis not present

## 2022-09-14 NOTE — Procedures (Signed)
   Patient Name: Rachael Sanford, [place] Date: 09/05/2022 Gender: Female D.O.B: November 14, 1966 Age (years): 42 Referring Provider: Fransico Him MD, ABSM Height (inches): 65 Interpreting Physician: Fransico Him MD, ABSM Weight (lbs): 277 RPSGT: Earney [place] BMI: 46 MRN: [id number] Neck Size: 20.00  CLINICAL INFORMATION The patient is referred for a CPAP titration to treat sleep apnea.  SLEEP STUDY TECHNIQUE As per the AASM Manual for the Scoring of Sleep and Associated Events v2.3 (April 2016) with a hypopnea requiring 4% desaturations.  The channels recorded and monitored were frontal, central and occipital EEG, electrooculogram (EOG), submentalis EMG (chin), nasal and oral airflow, thoracic and abdominal wall motion, anterior tibialis EMG, snore microphone, electrocardiogram, and pulse oximetry. Continuous positive airway pressure (CPAP) was initiated at the beginning of the study and titrated to treat sleep-disordered breathing.  MEDICATIONS Medications self-administered by patient taken the night of the study : ALLEGRA, neils sinus wash, EZETIMIBE, pravasatatin sodium  TECHNICIAN COMMENTS Comments added by technician: Patient was restless all through the night. Comments added by scorer: N/A  RESPIRATORY PARAMETERS Optimal PAP Pressure (cm): 13  AHI at Optimal Pressure (/hr):0 Overall Minimal O2 (%):77.0  Supine % at Optimal Pressure (%):100 Minimal O2 at Optimal Pressure (%): 85.0   SLEEP ARCHITECTURE The study was initiated at 11:53:56 PM and ended at 5:55:15 AM.  Sleep onset time was 17.9 minutes and the sleep efficiency was 78.7%. The total sleep time was 284.5 minutes.  The patient spent 1.6% of the night in stage N1 sleep, 75.4% in stage N2 sleep, 0.0% in stage N3 and 23% in REM.Stage REM latency was 73.0 minutes  Wake after sleep onset was 58.9. Alpha intrusion was absent. Supine sleep was 93.32%.  CARDIAC DATA The 2 lead EKG demonstrated sinus rhythm. The mean  heart rate was 71.4 beats per minute. Other EKG findings include: PVCs   LEG MOVEMENT DATA The total Periodic Limb Movements of Sleep (PLMS) were 0. The PLMS index was 0.0. A PLMS index of <15 is considered normal in adults.  IMPRESSIONS - The optimal PAP pressure was 13 cm of water. - Severe oxygen desaturations were observed during this titration (min O2 = 77.0%). - The patient snored with moderate snoring volume during this titration study. - PVCs were observed during this study. - Clinically significant periodic limb movements were not noted during this study. Arousals associated with PLMs were rare.  DIAGNOSIS - Obstructive Sleep Apnea (G47.33) - Nocturnal Hypoxemia  RECOMMENDATIONS - Trial of auto CPAP therapy from 4 to 15cm H2O with a Small size Resmed Full Face AirFit F10 mask and heated humidification. - Avoid alcohol, sedatives and other CNS depressants that may worsen sleep apnea and disrupt normal sleep architecture. - Sleep hygiene should be reviewed to assess factors that may improve sleep quality. - Weight management and regular exercise should be initiated or continued. - Return to Sleep Center for re-evaluation after 6 weeks of therapy  [Electronically signed] 09/14/2022 08:48 PM  Fransico Him MD, ABSM Diplomate, American Board of Sleep Medicine

## 2022-09-17 DIAGNOSIS — Z1211 Encounter for screening for malignant neoplasm of colon: Secondary | ICD-10-CM

## 2022-09-17 DIAGNOSIS — K5900 Constipation, unspecified: Secondary | ICD-10-CM

## 2022-09-17 HISTORY — DX: Constipation, unspecified: K59.00

## 2022-09-17 HISTORY — DX: Encounter for screening for malignant neoplasm of colon: Z12.11

## 2022-09-17 HISTORY — DX: Morbid (severe) obesity due to excess calories: E66.01

## 2022-09-19 ENCOUNTER — Ambulatory Visit: Payer: BC Managed Care – PPO | Attending: Cardiology | Admitting: Cardiology

## 2022-09-19 ENCOUNTER — Encounter: Payer: Self-pay | Admitting: Cardiology

## 2022-09-19 VITALS — BP 136/50 | HR 76 | Ht 66.0 in | Wt 279.0 lb

## 2022-09-19 DIAGNOSIS — Z794 Long term (current) use of insulin: Secondary | ICD-10-CM

## 2022-09-19 DIAGNOSIS — G4733 Obstructive sleep apnea (adult) (pediatric): Secondary | ICD-10-CM

## 2022-09-19 DIAGNOSIS — R0609 Other forms of dyspnea: Secondary | ICD-10-CM | POA: Diagnosis not present

## 2022-09-19 DIAGNOSIS — I272 Pulmonary hypertension, unspecified: Secondary | ICD-10-CM | POA: Diagnosis not present

## 2022-09-19 DIAGNOSIS — E119 Type 2 diabetes mellitus without complications: Secondary | ICD-10-CM

## 2022-09-19 HISTORY — DX: Obstructive sleep apnea (adult) (pediatric): G47.33

## 2022-09-19 NOTE — Addendum Note (Signed)
Addended by: Darrel Reach on: 09/19/2022 01:53 PM   Modules accepted: Orders

## 2022-09-19 NOTE — Progress Notes (Signed)
Cardiology Office Note:    Date:  09/19/2022   ID:  Rachael Sanford, DOB 1966-10-04, MRN [id number]  PCP:  Street, Sharon Mt, MD  Cardiologist:  Jenne Campus, MD    Referring MD: Street, Sharon Mt, *   Chief Complaint  Patient presents with   Follow-up    History of Present Illness:    Rachael Sanford is a 56 y.o. female with past medical history significant for pulmonary hypertension with pulmonary pressure of neighborhood of 50 mmHg, morbid obesity, diabetes, dyslipidemia, polycystic ovarian, essential hypertension.  Recently she had sleep study done which was positive.  After that she got CPAP mask trial did very well and actually tells me even after 1 night of using that equipment she felt much better.  She is waiting for her CPAP mask to be delivered to her house.  Denies have any chest pain tightness squeezing pressure mid chest no palpitation she gained about 20 pounds that she is very disappointed about herself.  Past Medical History:  Diagnosis Date   Acquired deformity of toenail 02/16/2020   Cancer ([place]) 2009   FACIAL MELANOMA   Colon cancer screening 09/17/2022   Constipation 09/17/2022   Diabetes mellitus    TYPE II   Dysmenorrhea 02/24/2012   Dyspnea on exertion 01/20/2019   High cholesterol    History of diabetic ulcer of foot 05/07/2022   Hypertension    Ingrown nail 09/11/2016   Menorrhagia 02/24/2012   Morbid obesity ([place]) 09/17/2022   Plantar callus 02/16/2020   Pre-ulcerative corn or callous 09/11/2016   Prominent metatarsal head of left foot 10/03/2021   Seizure ([place])    AS AN INFANT   Tinea unguium 05/07/2022   Ulcer of left foot, limited to breakdown of skin ([place]) 01/31/2022    Past Surgical History:  Procedure Laterality Date   BREAST CYST EXCISION Left    melanoma removal  2009   from face    [place]  10/2018   TRIGGER FINGER RELEASE      Current Medications: Current Meds  Medication Sig   acetaminophen  (TYLENOL) 500 MG tablet Take 500 mg by mouth every 8 (eight) hours as needed for mild pain or moderate pain.   aspirin 81 MG tablet Take 81 mg by mouth daily.   b complex vitamins capsule Take 1 capsule by mouth daily.   Biotin 10000 MCG TABS Take 1 tablet by mouth daily.   CALCIUM-MAGNESIUM-ZINC PO Take 1 tablet by mouth daily.   CINNAMON PO Take 1-2 tablets by mouth daily. 1,'000mg'$  to 2,'000mg'$    co-enzyme Q-10 30 MG capsule Take 1 capsule by mouth daily.   diclofenac Sodium (VOLTAREN) 1 % GEL Apply 1 application. topically daily as needed for pain.   ezetimibe (ZETIA) 10 MG tablet Take 10 mg by mouth daily.   fexofenadine (ALLEGRA) 180 MG tablet Take 180 mg by mouth daily as needed for allergies or rhinitis.   furosemide (LASIX) 20 MG tablet Take 0.5-1 tablets by mouth daily as needed for edema.   HUMALOG KWIKPEN 100 UNIT/ML KwikPen Inject 35 Units into the skin 3 (three) times daily.   Insulin Glargine (BASAGLAR KWIKPEN) 100 UNIT/ML Inject 75 Units into the skin 2 (two) times daily.   KLOR-CON M20 20 MEQ tablet Take 20 mEq by mouth daily.   losartan (COZAAR) 100 MG tablet Take 100 mg by mouth daily.   metFORMIN (GLUCOPHAGE) 1000 MG tablet Take 1,000 mg by mouth 2 (  two) times daily.   methocarbamol (ROBAXIN) 750 MG tablet Take 750 mg by mouth every 8 (eight) hours as needed for muscle spasms.   Multiple Vitamins-Minerals (CENTRUM SILVER 50+WOMEN PO) Take 1 tablet by mouth daily.   pravastatin (PRAVACHOL) 40 MG tablet Take 40 mg by mouth daily.   Psyllium (METAMUCIL PO) Take 1 Dose by mouth daily.     Allergies:   Sulfamethoxazole-trimethoprim, Seldane [terfenadine], and Septra [bactrim]   Social History   Socioeconomic History   Marital status: Single    Spouse name: Not on file   Number of children: 0   Years of education: Not on file   Highest education level: Not on file  Occupational History   Not on file  Tobacco Use   Smoking status: Never   Smokeless tobacco: Never  Vaping  Use   Vaping Use: Never used  Substance and Sexual Activity   Alcohol use: Yes    Comment: seldom   Drug use: No   Sexual activity: Yes    Birth control/protection: Pill  Other Topics Concern   Not on file  Social History Narrative   Not on file   Social Determinants of Health   Financial Resource Strain: Not on file  Food Insecurity: Not on file  Transportation Needs: Not on file  Physical Activity: Not on file  Stress: Not on file  Social Connections: Not on file     Family History: The patient's family history includes Breast cancer in her mother; Cancer in her maternal uncle, mother, paternal aunt, and paternal grandfather; Diabetes in her father and paternal grandmother; Heart disease in her father and paternal grandmother; Hyperlipidemia in her father; Hypertension in her maternal aunt; Macular degeneration in her father and paternal aunt. ROS:   Please see the history of present illness.    All 14 point review of systems negative except as described per history of present illness  EKGs/Labs/Other Studies Reviewed:      Recent Labs: 04/24/2022: ALT 25; BUN 16; Creatinine, Ser 0.70; NT-Pro BNP 88; Potassium 5.1; Sodium 139  Recent Lipid Panel No results found for: "CHOL", "TRIG", "HDL", "CHOLHDL", "VLDL", "LDLCALC", "LDLDIRECT"  Physical Exam:    VS:  BP (!) 136/50 (BP Location: Left Arm, Patient Position: Sitting)   Pulse 76   Ht '5\' 6"'$  (1.676 m)   Wt 279 lb (126.6 kg)   LMP 08/31/2017   SpO2 94%   BMI 45.03 kg/m     Wt Readings from Last 3 Encounters:  09/19/22 279 lb (126.6 kg)  09/06/22 277 lb (125.6 kg)  04/24/22 277 lb (125.6 kg)     GEN:  Well nourished, well developed in no acute distress HEENT: Normal NECK: No JVD; No carotid bruits LYMPHATICS: No lymphadenopathy CARDIAC: RRR, no murmurs, no rubs, no gallops RESPIRATORY:  Clear to auscultation without rales, wheezing or rhonchi  ABDOMEN: Soft, non-tender, non-distended MUSCULOSKELETAL:  No  edema; No deformity  SKIN: Warm and dry LOWER EXTREMITIES: no swelling NEUROLOGIC:  Alert and oriented x 3 PSYCHIATRIC:  Normal affect   ASSESSMENT:    1. Pulmonary hypertension, unspecified ([place])   2. Type 2 diabetes mellitus without complication, with long-term current use of insulin ([place])   3. Morbid obesity ([place])   4. Dyspnea on exertion   5. Obstructive sleep apnea    PLAN:    In order of problems listed above:  Obstructive sleep apnea she is awaiting for equipment.  She does have pulmonary hypertension so far VQ scan negative, she  does have some degree of diastolic dysfunctions however most culprit appears to be sleep apnea.  Awaiting equipment we will check her pulmonary pressures a few months after CPAP treatment will be initiated. Type 2 diabetes followed by internal medicine team.  I did review the lumbar test from the summer her hemoglobin was A1c was 9.5 which is very high and I told her she need to do every which she can to improve that. Dyspnea and exertion multifactorial but echocardiogram showed preserved ejection fraction. Dyslipidemia I did review her K PN which show me total cholesterol 154 HDL 67.  It is a good HDL.  She is on Zetia as well as pravastatin which I will continue   Medication Adjustments/Labs and Tests Ordered: Current medicines are reviewed at length with the patient today.  Concerns regarding medicines are outlined above.  No orders of the defined types were placed in this encounter.  Medication changes: No orders of the defined types were placed in this encounter.   Signed, Park Liter, MD, [facility] 09/19/2022 1:48 PM    [facility]

## 2022-09-19 NOTE — Patient Instructions (Signed)
Medication Instructions:  Your physician recommends that you continue on your current medications as directed. Please refer to the Current Medication list given to you today.  *If you need a refill on your cardiac medications before your next appointment, please call your pharmacy*   Lab Work: None If you have labs (blood work) drawn today and your tests are completely normal, you will receive your results only by: [place] (if you have MyChart) OR A paper copy in the mail If you have any lab test that is abnormal or we need to change your treatment, we will call you to review the results.   Testing/Procedures: Your physician has requested that you have an echocardiogram 2 weeks prior to your 5 month follow up with Dr. Agustin Cree. Echocardiography is a painless test that uses sound waves to create images of your heart. It provides your doctor with information about the size and shape of your heart and how well your heart's chambers and valves are working. This procedure takes approximately one hour. There are no restrictions for this procedure. Please do NOT wear cologne, perfume, aftershave, or lotions (deodorant is allowed). Please arrive 15 minutes prior to your appointment time.    Follow-Up: At [facility], you and your health needs are our priority.  As part of our continuing mission to provide you with exceptional heart care, we have created designated Provider Care Teams.  These Care Teams include your primary Cardiologist (physician) and Advanced Practice Providers (APPs -  Physician Assistants and Nurse Practitioners) who all work together to provide you with the care you need, when you need it.  We recommend signing up for the patient portal called "MyChart".  Sign up information is provided on this After Visit Summary.  MyChart is used to connect with patients for Virtual Visits (Telemedicine).  Patients are able to view lab/test results, encounter notes, upcoming  appointments, etc.  Non-urgent messages can be sent to your provider as well.   To learn more about what you can do with MyChart, go to [email or web address].    Your next appointment:   5 month(s)  The format for your next appointment:   In Person  Provider:   Jenne Campus, MD    Other Instructions   Important Information About Sugar

## 2022-09-22 ENCOUNTER — Telehealth: Payer: Self-pay | Admitting: *Deleted

## 2022-09-22 DIAGNOSIS — G4733 Obstructive sleep apnea (adult) (pediatric): Secondary | ICD-10-CM

## 2022-09-22 NOTE — Telephone Encounter (Signed)
Please get overnight pulse ox on CPAP  

## 2022-09-22 NOTE — Telephone Encounter (Signed)
-----   Message from Lauralee Evener, [place] sent at 09/15/2022  8:21 AM EDT -----  ----- Message ----- From: Sueanne Margarita, MD Sent: 09/14/2022   8:51 PM EDT To: Cv Div Sleep Studies  Please let patient know that they had a successful PAP titration and let DME know that orders are in EPIC.  Please set up 6 week OV with me.

## 2022-09-29 NOTE — Telephone Encounter (Signed)
The patient has been notified of the result. Left detailed message on voicemail and informed patient to call back..[place], CMA   

## 2022-10-03 NOTE — Addendum Note (Signed)
Addended by: Freada Bergeron on: 10/03/2022 12:15 PM   Modules accepted: Orders

## 2022-10-03 NOTE — Telephone Encounter (Signed)
Return call:  The patient has been notified of the result and verbalized understanding.  All questions (if any) were answered. Marolyn Hammock, [place] 10/03/2022 11:08 AM

## 2022-10-15 DIAGNOSIS — L03116 Cellulitis of left lower limb: Secondary | ICD-10-CM | POA: Diagnosis not present

## 2022-10-19 DIAGNOSIS — L03116 Cellulitis of left lower limb: Secondary | ICD-10-CM | POA: Diagnosis not present

## 2022-10-24 DIAGNOSIS — L97921 Non-pressure chronic ulcer of unspecified part of left lower leg limited to breakdown of skin: Secondary | ICD-10-CM | POA: Diagnosis not present

## 2022-10-24 DIAGNOSIS — L821 Other seborrheic keratosis: Secondary | ICD-10-CM | POA: Diagnosis not present

## 2022-10-24 DIAGNOSIS — Z85820 Personal history of malignant melanoma of skin: Secondary | ICD-10-CM | POA: Diagnosis not present

## 2022-10-24 DIAGNOSIS — D225 Melanocytic nevi of trunk: Secondary | ICD-10-CM | POA: Diagnosis not present

## 2022-11-10 DIAGNOSIS — E119 Type 2 diabetes mellitus without complications: Secondary | ICD-10-CM | POA: Diagnosis not present

## 2022-11-14 ENCOUNTER — Ambulatory Visit
Admission: RE | Admit: 2022-11-14 | Discharge: 2022-11-14 | Disposition: A | Discharge status: Home / Self Care | Payer: BC Managed Care – PPO | Source: Ambulatory Visit | Attending: Family Medicine | Admitting: Family Medicine

## 2022-11-14 ENCOUNTER — Ambulatory Visit: Payer: BC Managed Care – PPO

## 2022-11-14 DIAGNOSIS — Z0000 Encounter for general adult medical examination without abnormal findings: Secondary | ICD-10-CM | POA: Diagnosis not present

## 2022-11-14 DIAGNOSIS — E1165 Type 2 diabetes mellitus with hyperglycemia: Secondary | ICD-10-CM | POA: Diagnosis not present

## 2022-11-14 DIAGNOSIS — E1149 Type 2 diabetes mellitus with other diabetic neurological complication: Secondary | ICD-10-CM | POA: Diagnosis not present

## 2022-11-14 DIAGNOSIS — Z6841 Body Mass Index (BMI) 40.0 and over, adult: Secondary | ICD-10-CM | POA: Diagnosis not present

## 2022-11-14 DIAGNOSIS — Z1231 Encounter for screening mammogram for malignant neoplasm of breast: Secondary | ICD-10-CM

## 2022-11-18 DIAGNOSIS — J019 Acute sinusitis, unspecified: Secondary | ICD-10-CM | POA: Diagnosis not present

## 2022-11-19 DIAGNOSIS — A09 Infectious gastroenteritis and colitis, unspecified: Secondary | ICD-10-CM | POA: Diagnosis not present

## 2022-11-25 NOTE — Telephone Encounter (Signed)
Upon patient request DME selection is [facility] Patient understands he will be contacted by [facility] to set up his cpap. Patient understands to call if Luis Lopez does not contact him with new setup in a timely manner. Patient understands they will be called once confirmation has been received from [facility] that they have received their new machine to schedule 10 week follow up appointment.   [facility] notified of new cpap order  Please add to airview Patient was grateful for the call and thanked me.

## 2022-12-05 DIAGNOSIS — M545 Low back pain, unspecified: Secondary | ICD-10-CM | POA: Diagnosis not present

## 2022-12-05 DIAGNOSIS — L03115 Cellulitis of right lower limb: Secondary | ICD-10-CM | POA: Diagnosis not present

## 2022-12-07 DIAGNOSIS — R071 Chest pain on breathing: Secondary | ICD-10-CM | POA: Diagnosis not present

## 2022-12-07 DIAGNOSIS — R5383 Other fatigue: Secondary | ICD-10-CM | POA: Diagnosis not present

## 2022-12-07 DIAGNOSIS — M549 Dorsalgia, unspecified: Secondary | ICD-10-CM | POA: Diagnosis not present

## 2022-12-07 DIAGNOSIS — H6093 Unspecified otitis externa, bilateral: Secondary | ICD-10-CM | POA: Diagnosis not present

## 2022-12-07 DIAGNOSIS — R519 Headache, unspecified: Secondary | ICD-10-CM | POA: Diagnosis not present

## 2022-12-07 DIAGNOSIS — M791 Myalgia, unspecified site: Secondary | ICD-10-CM | POA: Diagnosis not present

## 2022-12-12 DIAGNOSIS — G4733 Obstructive sleep apnea (adult) (pediatric): Secondary | ICD-10-CM | POA: Diagnosis not present

## 2022-12-15 DIAGNOSIS — Z8631 Personal history of diabetic foot ulcer: Secondary | ICD-10-CM | POA: Diagnosis not present

## 2022-12-15 DIAGNOSIS — B351 Tinea unguium: Secondary | ICD-10-CM | POA: Diagnosis not present

## 2022-12-15 DIAGNOSIS — Z794 Long term (current) use of insulin: Secondary | ICD-10-CM | POA: Diagnosis not present

## 2022-12-15 DIAGNOSIS — E1142 Type 2 diabetes mellitus with diabetic polyneuropathy: Secondary | ICD-10-CM | POA: Diagnosis not present

## 2022-12-18 DIAGNOSIS — M7061 Trochanteric bursitis, right hip: Secondary | ICD-10-CM | POA: Diagnosis not present

## 2022-12-19 DIAGNOSIS — G63 Polyneuropathy in diseases classified elsewhere: Secondary | ICD-10-CM | POA: Diagnosis not present

## 2022-12-19 DIAGNOSIS — M7061 Trochanteric bursitis, right hip: Secondary | ICD-10-CM | POA: Diagnosis not present

## 2022-12-19 DIAGNOSIS — E1149 Type 2 diabetes mellitus with other diabetic neurological complication: Secondary | ICD-10-CM | POA: Diagnosis not present

## 2022-12-19 DIAGNOSIS — L139 Bullous disorder, unspecified: Secondary | ICD-10-CM | POA: Diagnosis not present

## 2023-01-02 DIAGNOSIS — H25043 Posterior subcapsular polar age-related cataract, bilateral: Secondary | ICD-10-CM | POA: Diagnosis not present

## 2023-01-02 DIAGNOSIS — H18413 Arcus senilis, bilateral: Secondary | ICD-10-CM | POA: Diagnosis not present

## 2023-01-02 DIAGNOSIS — H40031 Anatomical narrow angle, right eye: Secondary | ICD-10-CM | POA: Diagnosis not present

## 2023-01-02 DIAGNOSIS — H2513 Age-related nuclear cataract, bilateral: Secondary | ICD-10-CM | POA: Diagnosis not present

## 2023-01-02 DIAGNOSIS — H40033 Anatomical narrow angle, bilateral: Secondary | ICD-10-CM | POA: Diagnosis not present

## 2023-01-02 DIAGNOSIS — H2511 Age-related nuclear cataract, right eye: Secondary | ICD-10-CM | POA: Diagnosis not present

## 2023-01-12 DIAGNOSIS — G4733 Obstructive sleep apnea (adult) (pediatric): Secondary | ICD-10-CM | POA: Diagnosis not present

## 2023-01-16 ENCOUNTER — Ambulatory Visit: Payer: BC Managed Care – PPO | Admitting: Cardiology

## 2023-01-16 DIAGNOSIS — H40033 Anatomical narrow angle, bilateral: Secondary | ICD-10-CM | POA: Diagnosis not present

## 2023-01-16 DIAGNOSIS — H40032 Anatomical narrow angle, left eye: Secondary | ICD-10-CM | POA: Diagnosis not present

## 2023-01-30 ENCOUNTER — Ambulatory Visit: Payer: BC Managed Care – PPO | Attending: Cardiology

## 2023-01-30 DIAGNOSIS — I272 Pulmonary hypertension, unspecified: Secondary | ICD-10-CM | POA: Diagnosis not present

## 2023-01-30 LAB — ECHOCARDIOGRAM COMPLETE
AR max vel: 3.1 cm2
AV Area VTI: 3.02 cm2
AV Area mean vel: 3.25 cm2
AV Mean grad: 7 mmHg
AV Peak grad: 10.8 mmHg
Ao pk vel: 1.64 m/s
Area-P 1/2: 3.99 cm2
S' Lateral: 3.7 cm

## 2023-01-30 MED ORDER — PERFLUTREN LIPID MICROSPHERE
1.0000 mL | INTRAVENOUS | Status: AC | PRN
  Administered 2023-01-30: 10 mL via INTRAVENOUS

## 2023-02-02 DIAGNOSIS — I83029 Varicose veins of left lower extremity with ulcer of unspecified site: Secondary | ICD-10-CM | POA: Diagnosis not present

## 2023-02-02 DIAGNOSIS — L139 Bullous disorder, unspecified: Secondary | ICD-10-CM | POA: Diagnosis not present

## 2023-02-02 DIAGNOSIS — E1149 Type 2 diabetes mellitus with other diabetic neurological complication: Secondary | ICD-10-CM | POA: Diagnosis not present

## 2023-02-02 DIAGNOSIS — E785 Hyperlipidemia, unspecified: Secondary | ICD-10-CM | POA: Diagnosis not present

## 2023-02-02 DIAGNOSIS — G63 Polyneuropathy in diseases classified elsewhere: Secondary | ICD-10-CM | POA: Diagnosis not present

## 2023-02-02 LAB — LAB REPORT - SCANNED
A1c: 10.3
Albumin, Urine POC: 5
EGFR: 60

## 2023-02-05 ENCOUNTER — Telehealth: Payer: Self-pay

## 2023-02-05 NOTE — Telephone Encounter (Signed)
Unable to reach the patient by phone, left couple messages. Last attempt I sent a my chart message with results details.

## 2023-02-05 NOTE — Telephone Encounter (Signed)
-----   Message from Park Liter, MD sent at 02/04/2023 11:06 AM EDT ----- Echocardiogram showed normal left ventricle ejection fraction, there is diastolic dysfunction but overall looks fine

## 2023-02-06 ENCOUNTER — Other Ambulatory Visit: Payer: Self-pay

## 2023-02-06 DIAGNOSIS — R569 Unspecified convulsions: Secondary | ICD-10-CM | POA: Insufficient documentation

## 2023-02-06 DIAGNOSIS — I10 Essential (primary) hypertension: Secondary | ICD-10-CM | POA: Insufficient documentation

## 2023-02-06 NOTE — Telephone Encounter (Signed)
Patient is returning call to discuss echo results. °

## 2023-02-06 NOTE — Telephone Encounter (Signed)
Patient informed of the results of her echo °

## 2023-02-10 DIAGNOSIS — G4733 Obstructive sleep apnea (adult) (pediatric): Secondary | ICD-10-CM | POA: Diagnosis not present

## 2023-02-20 ENCOUNTER — Encounter: Payer: Self-pay | Admitting: Cardiology

## 2023-02-20 ENCOUNTER — Ambulatory Visit: Payer: BC Managed Care – PPO | Attending: Cardiology | Admitting: Cardiology

## 2023-02-20 VITALS — BP 132/58 | HR 73 | Ht 64.0 in | Wt 271.6 lb

## 2023-02-20 DIAGNOSIS — E7800 Pure hypercholesterolemia, unspecified: Secondary | ICD-10-CM | POA: Diagnosis not present

## 2023-02-20 DIAGNOSIS — E1142 Type 2 diabetes mellitus with diabetic polyneuropathy: Secondary | ICD-10-CM

## 2023-02-20 DIAGNOSIS — G4733 Obstructive sleep apnea (adult) (pediatric): Secondary | ICD-10-CM

## 2023-02-20 DIAGNOSIS — I10 Essential (primary) hypertension: Secondary | ICD-10-CM

## 2023-02-20 NOTE — Progress Notes (Signed)
Cardiology Office Note:    Date:  02/20/2023   ID:  Rachael Sanford, DOB 1966/09/19, MRN [id number]  PCP:  Street, Sharon Mt, MD  Cardiologist:  Jenean Lindau, MD   Referring MD: Street, Sharon Mt, *    ASSESSMENT:    1. Primary hypertension   2. Obstructive sleep apnea   3. Diabetic polyneuropathy associated with type 2 diabetes mellitus ([place])   4. High cholesterol   5. Morbid obesity ([place])    PLAN:    In order of problems listed above:  Primary prevention stressed with the patient.  Importance of compliance with diet medication stressed and she vocalized understanding. Essential hypertension: Blood pressure stable and diet was emphasized.  Lifestyle modification urged.  She was advised to walk at least half an hour a day 5 days a week and she agrees to do so. Uncontrolled diabetes mellitus: I reviewed lab work and hemoglobin A1c is markedly elevated and she is aware of this.  Diet emphasized.  Risks of uncontrolled diabetes emphasized. Morbid obesity and sleep apnea: Sleep health issues were discussed.  Risks of obesity explained and she promises to monitor.  She has an appointment coming with the sleep specialist in the next few days. Mixed dyslipidemia: Lipids were reviewed and they are fine.  Diet emphasized. Patient will be seen in follow-up appointment in 9 months or earlier if the patient has any concerns.    Medication Adjustments/Labs and Tests Ordered: Current medicines are reviewed at length with the patient today.  Concerns regarding medicines are outlined above.  No orders of the defined types were placed in this encounter.  No orders of the defined types were placed in this encounter.    No chief complaint on file.    History of Present Illness:    Rachael Sanford is a 57 y.o. female.  Patient has past medical history of essential hypertension, dyslipidemia, diabetes mellitus and morbid obesity.  She has obstructive sleep apnea.  She takes care of  activities of daily living.  She does not exercise on a regular basis.  Past Medical History:  Diagnosis Date   Acquired deformity of toenail 02/16/2020   Cancer ([place]) 2009   FACIAL MELANOMA   Colon cancer screening 09/17/2022   Constipation 09/17/2022   Diabetes mellitus, type II ([place]) 02/24/2012   Diabetic polyneuropathy associated with type 2 diabetes mellitus ([place]) 09/11/2016   Displacement of lumbar disc with radiculopathy 12/28/2019   Dysmenorrhea 02/24/2012   Dyspnea on exertion 01/20/2019   High cholesterol    History of diabetic ulcer of foot 05/07/2022   Hypertension    Ingrown nail 09/11/2016   Morbid obesity ([place]) 09/17/2022   Obstructive sleep apnea 09/19/2022   Overweight 02/24/2012   PCOS (polycystic ovarian syndrome) 02/24/2012   Plantar callus 02/16/2020   Prominent metatarsal head of left foot 10/03/2021   Pulmonary hypertension, unspecified ([place]) 04/24/2022   Seizure ([place])    AS AN INFANT   Tinea unguium 05/07/2022   Ulcer of left foot, limited to breakdown of skin ([place]) 01/31/2022    Past Surgical History:  Procedure Laterality Date   BREAST CYST EXCISION Left    melanoma removal  2009   from face    [place]  10/2018   TRIGGER FINGER RELEASE      Current Medications: Current Meds  Medication Sig   acetaminophen (TYLENOL) 500 MG tablet Take 500 mg by mouth every 8 (eight) hours as needed  for mild pain or moderate pain.   aspirin 81 MG tablet Take 81 mg by mouth daily.   b complex vitamins capsule Take 1 capsule by mouth daily.   Biotin 10000 MCG TABS Take 1 tablet by mouth daily.   CALCIUM-MAGNESIUM-ZINC PO Take 1 tablet by mouth daily.   CINNAMON PO Take 1-2 tablets by mouth daily. 1,000mg  to 2,000mg    co-enzyme Q-10 30 MG capsule Take 1 capsule by mouth daily.   diclofenac Sodium (VOLTAREN) 1 % GEL Apply 1 application. topically daily as needed for pain.   ezetimibe (ZETIA) 10 MG tablet Take 10 mg by mouth daily.    fexofenadine (ALLEGRA) 180 MG tablet Take 180 mg by mouth daily as needed for allergies or rhinitis.   furosemide (LASIX) 20 MG tablet Take 0.5-1 tablets by mouth daily as needed for edema.   HUMALOG KWIKPEN 100 UNIT/ML KwikPen Inject 35 Units into the skin 3 (three) times daily.   Insulin Glargine (BASAGLAR KWIKPEN) 100 UNIT/ML Inject 75 Units into the skin 2 (two) times daily.   losartan (COZAAR) 100 MG tablet Take 100 mg by mouth daily.   metFORMIN (GLUCOPHAGE) 1000 MG tablet Take 1,000 mg by mouth 2 (two) times daily.   methocarbamol (ROBAXIN) 750 MG tablet Take 750 mg by mouth every 8 (eight) hours as needed for muscle spasms.   Multiple Vitamins-Minerals (CENTRUM SILVER 50+WOMEN PO) Take 1 tablet by mouth daily.   Potassium Chloride ER 20 MEQ TBCR Take 1 tablet by mouth daily.   pravastatin (PRAVACHOL) 40 MG tablet Take 40 mg by mouth daily.   Psyllium (METAMUCIL PO) Take 1 Dose by mouth daily.     Allergies:   Sulfamethoxazole-trimethoprim, Seldane [terfenadine], and Septra [bactrim]   Social History   Socioeconomic History   Marital status: Single    Spouse name: Not on file   Number of children: 0   Years of education: Not on file   Highest education level: Not on file  Occupational History   Not on file  Tobacco Use   Smoking status: Never   Smokeless tobacco: Never  Vaping Use   Vaping Use: Never used  Substance and Sexual Activity   Alcohol use: Yes    Comment: seldom   Drug use: No   Sexual activity: Yes    Birth control/protection: Pill  Other Topics Concern   Not on file  Social History Narrative   Not on file   Social Determinants of Health   Financial Resource Strain: Not on file  Food Insecurity: Not on file  Transportation Needs: Not on file  Physical Activity: Not on file  Stress: Not on file  Social Connections: Not on file     Family History: The patient's family history includes Breast cancer in her mother; Cancer in her maternal uncle,  mother, paternal aunt, and paternal grandfather; Diabetes in her father and paternal grandmother; Heart disease in her father and paternal grandmother; Hyperlipidemia in her father; Hypertension in her maternal aunt; Macular degeneration in her father and paternal aunt.  ROS:   Please see the history of present illness.    All other systems reviewed and are negative.  EKGs/Labs/Other Studies Reviewed:    The following studies were reviewed today: I discussed my findings with the patient at length.   Recent Labs: 04/24/2022: ALT 25; BUN 16; Creatinine, Ser 0.70; NT-Pro BNP 88; Potassium 5.1; Sodium 139  Recent Lipid Panel No results found for: "CHOL", "TRIG", "HDL", "CHOLHDL", "VLDL", "LDLCALC", "LDLDIRECT"  Physical Exam:  VS:  BP (!) 132/58 (BP Location: Left Arm, Patient Position: Sitting, Cuff Size: Normal)   Pulse 73   Ht 5\' 4"  (1.626 m)   Wt 271 lb 9.6 oz (123.2 kg)   LMP 08/31/2017   SpO2 96%   BMI 46.62 kg/m     Wt Readings from Last 3 Encounters:  02/20/23 271 lb 9.6 oz (123.2 kg)  09/19/22 279 lb (126.6 kg)  09/06/22 277 lb (125.6 kg)     GEN: Patient is in no acute distress HEENT: Normal NECK: No JVD; No carotid bruits LYMPHATICS: No lymphadenopathy CARDIAC: Hear sounds regular, 2/6 systolic murmur at the apex. RESPIRATORY:  Clear to auscultation without rales, wheezing or rhonchi  ABDOMEN: Soft, non-tender, non-distended MUSCULOSKELETAL:  No edema; No deformity  SKIN: Warm and dry NEUROLOGIC:  Alert and oriented x 3 PSYCHIATRIC:  Normal affect   Signed, Jenean Lindau, MD  02/20/2023 1:08 PM    [facility]

## 2023-02-20 NOTE — Patient Instructions (Signed)
Medication Instructions:  Your physician recommends that you continue on your current medications as directed. Please refer to the Current Medication list given to you today.  *If you need a refill on your cardiac medications before your next appointment, please call your pharmacy*   Lab Work: None If you have labs (blood work) drawn today and your tests are completely normal, you will receive your results only by: [place] (if you have MyChart) OR A paper copy in the mail If you have any lab test that is abnormal or we need to change your treatment, we will call you to review the results.   Testing/Procedures: None   Follow-Up: At [facility], you and your health needs are our priority.  As part of our continuing mission to provide you with exceptional heart care, we have created designated Provider Care Teams.  These Care Teams include your primary Cardiologist (physician) and Advanced Practice Providers (APPs -  Physician Assistants and Nurse Practitioners) who all work together to provide you with the care you need, when you need it.  We recommend signing up for the patient portal called "MyChart".  Sign up information is provided on this After Visit Summary.  MyChart is used to connect with patients for Virtual Visits (Telemedicine).  Patients are able to view lab/test results, encounter notes, upcoming appointments, etc.  Non-urgent messages can be sent to your provider as well.   To learn more about what you can do with MyChart, go to [email or web address].    Your next appointment:   9 month(s)  Provider:   Jenne Campus, MD    Other Instructions None

## 2023-02-23 DIAGNOSIS — L97929 Non-pressure chronic ulcer of unspecified part of left lower leg with unspecified severity: Secondary | ICD-10-CM | POA: Diagnosis not present

## 2023-02-23 DIAGNOSIS — I83028 Varicose veins of left lower extremity with ulcer other part of lower leg: Secondary | ICD-10-CM | POA: Diagnosis not present

## 2023-02-23 DIAGNOSIS — I83029 Varicose veins of left lower extremity with ulcer of unspecified site: Secondary | ICD-10-CM | POA: Diagnosis not present

## 2023-03-02 DIAGNOSIS — L97929 Non-pressure chronic ulcer of unspecified part of left lower leg with unspecified severity: Secondary | ICD-10-CM | POA: Diagnosis not present

## 2023-03-02 DIAGNOSIS — I83029 Varicose veins of left lower extremity with ulcer of unspecified site: Secondary | ICD-10-CM | POA: Diagnosis not present

## 2023-03-02 DIAGNOSIS — I83028 Varicose veins of left lower extremity with ulcer other part of lower leg: Secondary | ICD-10-CM | POA: Diagnosis not present

## 2023-03-09 DIAGNOSIS — L97929 Non-pressure chronic ulcer of unspecified part of left lower leg with unspecified severity: Secondary | ICD-10-CM | POA: Diagnosis not present

## 2023-03-09 DIAGNOSIS — I83028 Varicose veins of left lower extremity with ulcer other part of lower leg: Secondary | ICD-10-CM | POA: Diagnosis not present

## 2023-03-16 DIAGNOSIS — L97929 Non-pressure chronic ulcer of unspecified part of left lower leg with unspecified severity: Secondary | ICD-10-CM | POA: Diagnosis not present

## 2023-03-16 DIAGNOSIS — I83028 Varicose veins of left lower extremity with ulcer other part of lower leg: Secondary | ICD-10-CM | POA: Diagnosis not present

## 2023-03-23 DIAGNOSIS — H2511 Age-related nuclear cataract, right eye: Secondary | ICD-10-CM | POA: Diagnosis not present

## 2023-03-24 DIAGNOSIS — H25042 Posterior subcapsular polar age-related cataract, left eye: Secondary | ICD-10-CM | POA: Diagnosis not present

## 2023-03-24 DIAGNOSIS — H2512 Age-related nuclear cataract, left eye: Secondary | ICD-10-CM | POA: Diagnosis not present

## 2023-03-24 DIAGNOSIS — H25012 Cortical age-related cataract, left eye: Secondary | ICD-10-CM | POA: Diagnosis not present

## 2023-04-03 ENCOUNTER — Encounter: Payer: Self-pay | Admitting: Cardiology

## 2023-04-03 ENCOUNTER — Ambulatory Visit: Payer: BC Managed Care – PPO | Attending: Cardiology | Admitting: Cardiology

## 2023-04-03 VITALS — BP 116/55 | HR 72 | Ht 64.0 in | Wt 268.2 lb

## 2023-04-03 DIAGNOSIS — I10 Essential (primary) hypertension: Secondary | ICD-10-CM

## 2023-04-03 DIAGNOSIS — G4733 Obstructive sleep apnea (adult) (pediatric): Secondary | ICD-10-CM

## 2023-04-03 NOTE — Progress Notes (Signed)
Sleep Medicine CONSULT Note    Date:  04/03/2023   ID:  Rachael Sanford, DOB October 10, 1966, MRN [id number]  PCP:  Street, Stephanie Coup, MD  Cardiologist: None   Chief Complaint  Patient presents with   New Patient (Initial Visit)    Obstructive sleep apnea    History of Present Illness:  Rachael Sanford is a 57 y.o. female who is being seen today for the evaluation of OSA at the request of Dr. Bing Matter, MD. This is a 57 year old morbidly obese female with a history of diabetes mellitus type 2 complicated by dial diabetic polyneuropathy, hypertension, PCOS, pulmonary hypertension.  She was seen by Dr. Bing Matter in June 2023 and he was concerned because of her pulmonary hypertension and morbid obesity that she may be at risk for obstructive sleep apnea.  In June 23 she underwent home sleep study showing severe obstructive sleep apnea with an AHI of 30/h and nocturnal hypoxemia with O2 saturations less than 88% for 120 minutes and O2 sats as low as 68% nadir.  She underwent CPAP titration on 09/05/2022 and was titrated to auto CPAP from 4 to 15 cm H2O.  She is now referred for sleep medicine consultation to establish sleep care and treatment.  She started using the CPAP but then started having problems.  She went to [facility] because at her sleep study they did not have a mask for side sleepers.  Currently she has a full face mask and she tells me that when she is on her side she has to raise her face up at an awkward angle to accommodate her mask so it does not move.  She feels the pressure is adequate.  Since going on PAP she feels rested in the am and has no significant daytime sleepiness.  She denies any significant nasal dryness or nasal congestion.  She has problems with mouth dryness and has increased the humidity to the maximum humidity.  Her mask also rides up over her lower lip.  She does not think that he snores.     Past Medical History:  Diagnosis Date   Acquired deformity of  toenail 02/16/2020   Cancer (HCC) 2009   FACIAL MELANOMA   Colon cancer screening 09/17/2022   Constipation 09/17/2022   Diabetes mellitus, type II (HCC) 02/24/2012   Diabetic polyneuropathy associated with type 2 diabetes mellitus (HCC) 09/11/2016   Displacement of lumbar disc with radiculopathy 12/28/2019   Dysmenorrhea 02/24/2012   Dyspnea on exertion 01/20/2019   High cholesterol    History of diabetic ulcer of foot 05/07/2022   Hypertension    Ingrown nail 09/11/2016   Morbid obesity (HCC) 09/17/2022   Obstructive sleep apnea 09/19/2022   Overweight 02/24/2012   PCOS (polycystic ovarian syndrome) 02/24/2012   Plantar callus 02/16/2020   Prominent metatarsal head of left foot 10/03/2021   Pulmonary hypertension, unspecified (HCC) 04/24/2022   Seizure (HCC)    AS AN INFANT   Tinea unguium 05/07/2022   Ulcer of left foot, limited to breakdown of skin (HCC) 01/31/2022    Past Surgical History:  Procedure Laterality Date   BREAST CYST EXCISION Left    melanoma removal  2009   from face    MOLE REMOVAL     NASAL SINUS SURGERY  10/2018   TRIGGER FINGER RELEASE      Current Medications: No outpatient medications have been marked as taking for the 04/03/23 encounter (Office Visit) with Quintella Reichert, MD.    Allergies:  Sulfamethoxazole-trimethoprim, Seldane [terfenadine], and Septra [bactrim]   Social History   Socioeconomic History   Marital status: Single    Spouse name: Not on file   Number of children: 0   Years of education: Not on file   Highest education level: Not on file  Occupational History   Not on file  Tobacco Use   Smoking status: Never   Smokeless tobacco: Never  Vaping Use   Vaping Use: Never used  Substance and Sexual Activity   Alcohol use: Yes    Comment: seldom   Drug use: No   Sexual activity: Yes    Birth control/protection: Pill  Other Topics Concern   Not on file  Social History Narrative   Not on file   Social Determinants  of Health   Financial Resource Strain: Not on file  Food Insecurity: Not on file  Transportation Needs: Not on file  Physical Activity: Not on file  Stress: Not on file  Social Connections: Not on file     Family History:  The patient's family history includes Breast cancer in her mother; Cancer in her maternal uncle, mother, paternal aunt, and paternal grandfather; Diabetes in her father and paternal grandmother; Heart disease in her father and paternal grandmother; Hyperlipidemia in her father; Hypertension in her maternal aunt; Macular degeneration in her father and paternal aunt.   ROS:   Please see the history of present illness.    ROS All other systems reviewed and are negative.      No data to display             PHYSICAL EXAM:   VS:  LMP 08/31/2017    GEN: Well nourished, well developed, in no acute distress  HEENT: normal  Neck: no JVD, carotid bruits, or masses Cardiac: RRR; no murmurs, rubs, or gallops,no edema.  Intact distal pulses bilaterally.  Respiratory:  clear to auscultation bilaterally, normal work of breathing GI: soft, nontender, nondistended, + BS MS: no deformity or atrophy  Skin: warm and dry, no rash Neuro:  Alert and Oriented x 3, Strength and sensation are intact Psych: euthymic mood, full affect  Wt Readings from Last 3 Encounters:  02/20/23 271 lb 9.6 oz (123.2 kg)  09/19/22 279 lb (126.6 kg)  09/06/22 277 lb (125.6 kg)      Studies/Labs Reviewed:   Home sleep study, PAP compliance download and CPAP titration  Recent Labs: 04/24/2022: ALT 25; BUN 16; Creatinine, Ser 0.70; NT-Pro BNP 88; Potassium 5.1; Sodium 139    Additional studies/ records that were reviewed today include:  none    ASSESSMENT:    1. Obstructive sleep apnea   2. Primary hypertension      PLAN:  In order of problems listed above:  OSA - The patient is tolerating PAP therapy well without any problems.  The patient has been using and benefiting from PAP  use and will continue to benefit from therapy.   -I will get a download on her device from her DME -I will order her a new full face mask but an under the nose FFM -I have encouraged her to go online to order a sleep pillow to help with her side sleeping  2.  Hypertension -BP controlled on exam -Continue prescription drug management with losartan 100 mg daily with as needed refills  Medication Adjustments/Labs and Tests Ordered: Current medicines are reviewed at length with the patient today.  Concerns regarding medicines are outlined above.  Medication changes, Labs and Tests ordered  today are listed in the Patient Instructions below.  There are no Patient Instructions on file for this visit.  Followup with me in 6 weeks  Signed, Armanda Magic, MD  04/03/2023 3:29 PM    [facility] [address] [place], [place], [place]  [address] Phone: [phone]; Fax: [phone]

## 2023-04-03 NOTE — Patient Instructions (Signed)
Medication Instructions:  Your physician recommends that you continue on your current medications as directed. Please refer to the Current Medication list given to you today.  *If you need a refill on your cardiac medications before your next appointment, please call your pharmacy*   Lab Work: None.  If you have labs (blood work) drawn today and your tests are completely normal, you will receive your results only by: MyChart Message (if you have MyChart) OR A paper copy in the mail If you have any lab test that is abnormal or we need to change your treatment, we will call you to review the results.   Testing/Procedures: None.   Follow-Up: At [facility], you and your health needs are our priority.  As part of our continuing mission to provide you with exceptional heart care, we have created designated Provider Care Teams.  These Care Teams include your primary Cardiologist (physician) and Advanced Practice Providers (APPs -  Physician Assistants and Nurse Practitioners) who all work together to provide you with the care you need, when you need it.  We recommend signing up for the patient portal called "MyChart".  Sign up information is provided on this After Visit Summary.  MyChart is used to connect with patients for Virtual Visits (Telemedicine).  Patients are able to view lab/test results, encounter notes, upcoming appointments, etc.  Non-urgent messages can be sent to your provider as well.   To learn more about what you can do with MyChart, go to [email or web address].    Your next appointment:   6 week(s)  Provider:   Dr. Armanda Magic, MD   Other Instructions Dr. Mayford Knife has ordered an under-the-nose full face mask for you. Someone from our sleep team or your DME company will contact you about delivery of this equipment.

## 2023-04-05 DIAGNOSIS — L255 Unspecified contact dermatitis due to plants, except food: Secondary | ICD-10-CM | POA: Diagnosis not present

## 2023-04-05 DIAGNOSIS — R21 Rash and other nonspecific skin eruption: Secondary | ICD-10-CM | POA: Diagnosis not present

## 2023-04-06 ENCOUNTER — Telehealth: Payer: Self-pay | Admitting: *Deleted

## 2023-04-06 DIAGNOSIS — G4733 Obstructive sleep apnea (adult) (pediatric): Secondary | ICD-10-CM

## 2023-04-06 DIAGNOSIS — I272 Pulmonary hypertension, unspecified: Secondary | ICD-10-CM

## 2023-04-06 DIAGNOSIS — I10 Essential (primary) hypertension: Secondary | ICD-10-CM

## 2023-04-06 NOTE — Telephone Encounter (Signed)
Order placed to [facility] via fax.  

## 2023-04-06 NOTE — Telephone Encounter (Signed)
-----   Message from Luellen Pucker, RN sent at 04/03/2023  4:48 PM EDT ----- Regarding: cpap supplies Dr. Mayford Knife would like an under the nose full face mask for this patient. Thanks, Alcario Drought

## 2023-04-13 DIAGNOSIS — H2512 Age-related nuclear cataract, left eye: Secondary | ICD-10-CM | POA: Diagnosis not present

## 2023-05-01 ENCOUNTER — Other Ambulatory Visit: Payer: Self-pay | Admitting: Family Medicine

## 2023-05-01 DIAGNOSIS — Z0000 Encounter for general adult medical examination without abnormal findings: Secondary | ICD-10-CM

## 2023-05-15 ENCOUNTER — Encounter: Payer: Self-pay | Admitting: "Endocrinology

## 2023-05-15 ENCOUNTER — Ambulatory Visit: Payer: BC Managed Care – PPO | Admitting: "Endocrinology

## 2023-05-15 VITALS — BP 130/80 | HR 80 | Ht 64.0 in | Wt 269.8 lb

## 2023-05-15 DIAGNOSIS — Z794 Long term (current) use of insulin: Secondary | ICD-10-CM | POA: Diagnosis not present

## 2023-05-15 DIAGNOSIS — E1165 Type 2 diabetes mellitus with hyperglycemia: Secondary | ICD-10-CM

## 2023-05-15 DIAGNOSIS — E7800 Pure hypercholesterolemia, unspecified: Secondary | ICD-10-CM | POA: Diagnosis not present

## 2023-05-15 DIAGNOSIS — Z7984 Long term (current) use of oral hypoglycemic drugs: Secondary | ICD-10-CM | POA: Diagnosis not present

## 2023-05-15 LAB — POCT GLYCOSYLATED HEMOGLOBIN (HGB A1C): Hemoglobin A1C: 9.2 % — AB (ref 4.0–5.6)

## 2023-05-15 MED ORDER — DEXCOM G7 SENSOR MISC
1.0000 | 0 refills | Status: AC
Start: 2023-05-15 — End: ?

## 2023-05-15 MED ORDER — TIRZEPATIDE 2.5 MG/0.5ML ~~LOC~~ SOAJ
2.5000 mg | SUBCUTANEOUS | 0 refills | Status: DC
Start: 2023-05-15 — End: 2023-06-15

## 2023-05-15 MED ORDER — DEXCOM G7 RECEIVER DEVI
1.0000 | 0 refills | Status: AC
Start: 2023-05-15 — End: ?

## 2023-05-15 NOTE — Progress Notes (Signed)
Outpatient Endocrinology Note Rachael Chefornak, MD  05/15/23   Rachael Sanford [birth year] [id number]  Referring Provider: Bobbye Morton, * Primary Care Provider: Street, Stephanie Coup, MD Reason for consultation: Subjective   Assessment & Plan  Diagnoses and all orders for this visit:  Uncontrolled type 2 diabetes mellitus with hyperglycemia (HCC) -     POCT glycosylated hemoglobin (Hb A1C) -     tirzepatide (MOUNJARO) 2.5 MG/0.5ML Pen; Inject 2.5 mg into the skin once a week. -     Ambulatory referral to diabetic education -     Continuous Glucose Receiver (DEXCOM G7 RECEIVER) DEVI; 1 Device by Does not apply route continuous. -     Continuous Glucose Sensor (DEXCOM G7 SENSOR) MISC; 1 Device by Does not apply route continuous.  Long-term insulin use (HCC)  Long term (current) use of oral hypoglycemic drugs  Pure hypercholesterolemia    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, No results found for: "GFR" Hba1c goal less than 7, current Hba1c is  Lab Results  Component Value Date   HGBA1C 9.2 (A) 05/15/2023   Will recommend the following: Basaglar 75 units bid Humalog 35 units tidac 10 to 15 minutes before meals Metformin 1000 mg bid Start Mounjaro 2.5 mg weekly, discussed side effects, no contraindications Check blood sugars 3 times daily before meals and bring log next visit to make adjustments in the dose She has blood glucose targets fasting and random Ordered Dexcom  No known contraindications to any of above medications No history of MEN syndrome/medullary thyroid cancer/pancreatitis or pancreatic cancer in self or family Patient uptodate with eye and foot exam not noted  -Last LD and Tg are as follows: No results found for: "LDLCALC" No results found for: "TRIG" -On pravastatin 40 mg every day, ezetimibe 10 mg every day -Follow low fat diet and exercise   -Blood pressure goal <140/90 - Microalbumin/creatinine goal < 30 -Last MA/Cr is as  follows: No results found for: "MICROALBUR", "MALB24HUR" -on ACE/ARB Losartan 100 mg qd -diet changes including salt restriction -limit eating outside -counseled BP targets per standards of diabetes care -uncontrolled blood pressure can lead to retinopathy, nephropathy and cardiovascular and atherosclerotic heart disease  Reviewed and counseled on: -A1C target -Blood sugar targets -Complications of uncontrolled diabetes  -Checking blood sugar before meals and bedtime and bring log next visit -All medications with mechanism of action and side effects -Hypoglycemia management: rule of 15's, Glucagon Emergency Kit and medical alert ID -low-carb low-fat plate-method diet -At least 20 minutes of physical activity per day -Annual dilated retinal eye exam and foot exam -compliance and follow up needs -follow up as scheduled or earlier if problem gets worse  Call if blood sugar is less than 70 or consistently above 250    Take a 15 gm snack of carbohydrate at bedtime before you go to sleep if your blood sugar is less than 100.    If you are going to fast after midnight for a test or procedure, ask your physician for instructions on how to reduce/decrease your insulin dose.    Call if blood sugar is less than 70 or consistently above 250  -Treating a low sugar by rule of 15  (15 gms of sugar every 15 min until sugar is more than 70) If you feel your sugar is low, test your sugar to be sure If your sugar is low (less than 70), then take 15 grams of a fast acting Carbohydrate (3-4 glucose tablets or glucose  gel or 4 ounces of juice or regular soda) Recheck your sugar 15 min after treating low to make sure it is more than 70 If sugar is still less than 70, treat again with 15 grams of carbohydrate          Don't drive the hour of hypoglycemia  If unconscious/unable to eat or drink by mouth, use glucagon injection or nasal spray baqsimi and call 911. Can repeat again in 15 min if still  unconscious.  Return in about 4 weeks (around 06/12/2023).   I have reviewed current medications, nurse's notes, allergies, vital signs, past medical and surgical history, family medical history, and social history for this encounter. Counseled patient on symptoms, examination findings, lab findings, imaging results, treatment decisions and monitoring and prognosis. The patient understood the recommendations and agrees with the treatment plan. All questions regarding treatment plan were fully answered.  Rachael [place], MD  05/15/23    History of Present Illness Rachael Sanford is a 57 y.o. year old female who presents for evaluation of Type II diabetes mellitus.  JEFF MCCALLUM was first diagnosed in 57 years of age.   Diabetes education +  Home diabetes regimen: Basaglar 75 units bid Humalog 35 units tidac  Metformin 1000 mg bid  COMPLICATIONS -  MI/Stroke -  retinopathy +  neuropathy -  nephropathy  SYMPTOMS REVIEWED + Polyuria - Weight loss - Blurred vision  BLOOD SUGAR DATA Did not bring meter  Per recall, range is 180-275  Reviewed labs: 02/02/2023 TG 79 CHO 134 HDL 53 LDL 65 GFR more than 60 AST 1 4 ALT 21 Alb/Cr 1 8 A1c 10.3  Physical Exam  BP 130/80   Pulse 80   Ht 5\' 4"  (1.626 m)   Wt 269 lb 12.8 oz (122.4 kg)   LMP 08/31/2017   SpO2 96%   BMI 46.31 kg/m    Constitutional: well developed, well nourished Head: normocephalic, atraumatic Eyes: sclera anicteric, no redness Neck: supple Lungs: normal respiratory effort Neurology: alert and oriented Skin: dry, no appreciable rashes Musculoskeletal: no appreciable defects Psychiatric: normal mood and affect Diabetic Foot Exam - Simple   No data filed      Current Medications Patient's Medications  New Prescriptions   CONTINUOUS GLUCOSE RECEIVER (DEXCOM G7 RECEIVER) DEVI    1 Device by Does not apply route continuous.   CONTINUOUS GLUCOSE SENSOR (DEXCOM G7 SENSOR) MISC    1 Device by Does  not apply route continuous.   TIRZEPATIDE (MOUNJARO) 2.5 MG/0.5ML PEN    Inject 2.5 mg into the skin once a week.  Previous Medications   5-HYDROXYTRYPTOPHAN (5-HTP) 100 MG CAPS    Take by mouth.   ACETAMINOPHEN (TYLENOL) 500 MG TABLET    Take 500 mg by mouth every 8 (eight) hours as needed for mild pain or moderate pain.   ASPIRIN 81 MG TABLET    Take 81 mg by mouth daily.   B COMPLEX VITAMINS CAPSULE    Take 1 capsule by mouth daily.   BIOTIN [address] MCG TABS    Take 1 tablet by mouth daily.   CALCIUM-MAGNESIUM-ZINC PO    Take 1 tablet by mouth daily.   CINNAMON PO    Take 1-2 tablets by mouth daily. 1,000mg  to 2,000mg    CO-ENZYME Q-10 30 MG CAPSULE    Take 1 capsule by mouth daily.   DICLOFENAC SODIUM (VOLTAREN) 1 % GEL    Apply 1 application. topically daily as needed for pain.   EZETIMIBE (  ZETIA) 10 MG TABLET    Take 10 mg by mouth daily.   FEXOFENADINE (ALLEGRA) 180 MG TABLET    Take 180 mg by mouth daily as needed for allergies or rhinitis.   FUROSEMIDE (LASIX) 20 MG TABLET    Take 0.5-1 tablets by mouth daily as needed for edema.   GATIFLOXACIN (ZYMAXID) 0.5 % SOLN    Place 1 drop into the left eye 4 (four) times daily.   HUMALOG KWIKPEN 100 UNIT/ML KWIKPEN    Inject 35 Units into the skin 3 (three) times daily.   INSULIN GLARGINE (BASAGLAR KWIKPEN) 100 UNIT/ML    Inject 75 Units into the skin 2 (two) times daily.   KETOROLAC (ACULAR) 0.5 % OPHTHALMIC SOLUTION    Place 1 drop into the left eye 4 (four) times daily.   LOSARTAN (COZAAR) 100 MG TABLET    Take 100 mg by mouth daily.   METFORMIN (GLUCOPHAGE) 1000 MG TABLET    Take 1,000 mg by mouth 2 (two) times daily.   METHOCARBAMOL (ROBAXIN) 750 MG TABLET    Take 750 mg by mouth every 8 (eight) hours as needed for muscle spasms.   MULTIPLE VITAMINS-MINERALS (CENTRUM SILVER 50+WOMEN PO)    Take 1 tablet by mouth daily.   POTASSIUM CHLORIDE ER 20 MEQ TBCR    Take 1 tablet by mouth daily.   PRAVASTATIN (PRAVACHOL) 40 MG TABLET    Take 40 mg  by mouth daily.   PREDNISOLONE ACETATE (PRED FORTE) 1 % OPHTHALMIC SUSPENSION    Place 1 drop into the left eye 4 (four) times daily.   PSYLLIUM (METAMUCIL PO)    Take 1 Dose by mouth daily.   TURMERIC (QC TUMERIC COMPLEX) 500 MG CAPS    Take 2 tablets by mouth daily.  Modified Medications   No medications on file  Discontinued Medications   No medications on file    Allergies Allergies  Allergen Reactions   Sulfamethoxazole-Trimethoprim Other (See Comments)   Seldane [Terfenadine] Rash   Septra [Bactrim] Rash    Past Medical History Past Medical History:  Diagnosis Date   Acquired deformity of toenail 02/16/2020   Cancer (HCC) 2009   FACIAL MELANOMA   Colon cancer screening 09/17/2022   Constipation 09/17/2022   Diabetes mellitus, type II (HCC) 02/24/2012   Diabetic polyneuropathy associated with type 2 diabetes mellitus (HCC) 09/11/2016   Displacement of lumbar disc with radiculopathy 12/28/2019   Dysmenorrhea 02/24/2012   Dyspnea on exertion 01/20/2019   High cholesterol    History of diabetic ulcer of foot 05/07/2022   Hypertension    Ingrown nail 09/11/2016   Morbid obesity (HCC) 09/17/2022   Obstructive sleep apnea 09/19/2022   Overweight 02/24/2012   PCOS (polycystic ovarian syndrome) 02/24/2012   Plantar callus 02/16/2020   Prominent metatarsal head of left foot 10/03/2021   Pulmonary hypertension, unspecified (HCC) 04/24/2022   Seizure (HCC)    AS AN INFANT   Tinea unguium 05/07/2022   Ulcer of left foot, limited to breakdown of skin (HCC) 01/31/2022    Past Surgical History Past Surgical History:  Procedure Laterality Date   BREAST CYST EXCISION Left    melanoma removal  2009   from face    MOLE REMOVAL     NASAL SINUS SURGERY  10/2018   TRIGGER FINGER RELEASE      Family History family history includes Breast cancer in her mother; Cancer in her maternal uncle, mother, paternal aunt, and paternal grandfather; Diabetes in her father and paternal  grandmother; Heart disease in her father and paternal grandmother; Hyperlipidemia in her father; Hypertension in her maternal aunt; Macular degeneration in her father and paternal aunt.  Social History Social History   Socioeconomic History   Marital status: Single    Spouse name: Not on file   Number of children: 0   Years of education: Not on file   Highest education level: Not on file  Occupational History   Not on file  Tobacco Use   Smoking status: Never   Smokeless tobacco: Never  Vaping Use   Vaping Use: Never used  Substance and Sexual Activity   Alcohol use: Yes    Comment: seldom   Drug use: No   Sexual activity: Yes    Birth control/protection: Pill  Other Topics Concern   Not on file  Social History Narrative   Not on file   Social Determinants of Health   Financial Resource Strain: Not on file  Food Insecurity: Not on file  Transportation Needs: Not on file  Physical Activity: Not on file  Stress: Not on file  Social Connections: Not on file  Intimate Partner Violence: Not on file    Lab Results  Component Value Date   HGBA1C 9.2 (A) 05/15/2023   No results found for: "CHOL" No results found for: "HDL" No results found for: "LDLCALC" No results found for: "TRIG" No results found for: "CHOLHDL" Lab Results  Component Value Date   CREATININE 0.70 04/24/2022   No results found for: "GFR" No results found for: "MICROALBUR", "MALB24HUR"    Component Value Date/Time   NA 139 04/24/2022 0949   K 5.1 04/24/2022 0949   CL 102 04/24/2022 0949   CO2 24 04/24/2022 0949   GLUCOSE 207 (H) 04/24/2022 0949   BUN 16 04/24/2022 0949   CREATININE 0.70 04/24/2022 0949   CALCIUM 10.1 04/24/2022 0949   PROT 6.2 04/24/2022 0949   ALBUMIN 4.1 04/24/2022 0949   AST 20 04/24/2022 0949   ALT 25 04/24/2022 0949   ALKPHOS 70 04/24/2022 0949   BILITOT 0.2 04/24/2022 0949      Latest Ref Rng & Units 04/24/2022    9:49 AM  BMP  Glucose 70 - 99 mg/dL 324   BUN 6  - 24 mg/dL 16   Creatinine 4.01 - 1.00 mg/dL 0.27   BUN/Creat Ratio 9 - 23 23   Sodium 134 - 144 mmol/L 139   Potassium 3.5 - 5.2 mmol/L 5.1   Chloride 96 - 106 mmol/L 102   CO2 20 - 29 mmol/L 24   Calcium 8.7 - 10.2 mg/dL 25.3     No results found for: "WBC", "RBC", "HGB", "HCT", "PLT", "MCV", "MCH", "MCHC", "RDW", "LYMPHSABS", "MONOABS", "EOSABS", "BASOSABS"   Parts of this note may have been dictated using voice recognition software. There may be variances in spelling and vocabulary which are unintentional. Not all errors are proofread. Please notify the Thereasa Parkin if any discrepancies are noted or if the meaning of any statement is not clear.

## 2023-05-16 DIAGNOSIS — Z85820 Personal history of malignant melanoma of skin: Secondary | ICD-10-CM | POA: Diagnosis not present

## 2023-05-16 DIAGNOSIS — D225 Melanocytic nevi of trunk: Secondary | ICD-10-CM | POA: Diagnosis not present

## 2023-05-16 DIAGNOSIS — L820 Inflamed seborrheic keratosis: Secondary | ICD-10-CM | POA: Diagnosis not present

## 2023-05-21 ENCOUNTER — Ambulatory Visit: Payer: BC Managed Care – PPO | Attending: Cardiology | Admitting: Cardiology

## 2023-05-21 ENCOUNTER — Telehealth: Payer: Self-pay

## 2023-05-21 ENCOUNTER — Encounter: Payer: Self-pay | Admitting: Cardiology

## 2023-05-21 NOTE — Telephone Encounter (Signed)
Patient had visit today, no show, called and no answer. Left detailed message per DPR asking patient to call if she wants to reschedule visit.

## 2023-05-29 DIAGNOSIS — J0140 Acute pansinusitis, unspecified: Secondary | ICD-10-CM | POA: Diagnosis not present

## 2023-06-01 DIAGNOSIS — E785 Hyperlipidemia, unspecified: Secondary | ICD-10-CM | POA: Diagnosis not present

## 2023-06-01 DIAGNOSIS — I10 Essential (primary) hypertension: Secondary | ICD-10-CM | POA: Diagnosis not present

## 2023-06-01 DIAGNOSIS — E1149 Type 2 diabetes mellitus with other diabetic neurological complication: Secondary | ICD-10-CM | POA: Diagnosis not present

## 2023-06-01 DIAGNOSIS — G63 Polyneuropathy in diseases classified elsewhere: Secondary | ICD-10-CM | POA: Diagnosis not present

## 2023-06-05 DIAGNOSIS — M216X2 Other acquired deformities of left foot: Secondary | ICD-10-CM | POA: Diagnosis not present

## 2023-06-05 DIAGNOSIS — E1142 Type 2 diabetes mellitus with diabetic polyneuropathy: Secondary | ICD-10-CM | POA: Diagnosis not present

## 2023-06-05 DIAGNOSIS — E11621 Type 2 diabetes mellitus with foot ulcer: Secondary | ICD-10-CM | POA: Diagnosis not present

## 2023-06-05 DIAGNOSIS — L97521 Non-pressure chronic ulcer of other part of left foot limited to breakdown of skin: Secondary | ICD-10-CM | POA: Diagnosis not present

## 2023-06-14 ENCOUNTER — Other Ambulatory Visit: Payer: Self-pay | Admitting: "Endocrinology

## 2023-06-14 DIAGNOSIS — E1165 Type 2 diabetes mellitus with hyperglycemia: Secondary | ICD-10-CM

## 2023-06-15 ENCOUNTER — Other Ambulatory Visit: Payer: Self-pay | Admitting: "Endocrinology

## 2023-06-15 DIAGNOSIS — E1165 Type 2 diabetes mellitus with hyperglycemia: Secondary | ICD-10-CM

## 2023-06-15 MED ORDER — TIRZEPATIDE 5 MG/0.5ML ~~LOC~~ SOAJ
5.0000 mg | SUBCUTANEOUS | 0 refills | Status: DC
Start: 2023-06-15 — End: 2023-06-23

## 2023-06-23 ENCOUNTER — Encounter: Payer: Self-pay | Admitting: "Endocrinology

## 2023-06-23 ENCOUNTER — Ambulatory Visit: Payer: BC Managed Care – PPO | Admitting: "Endocrinology

## 2023-06-23 VITALS — BP 115/50 | HR 69 | Ht 64.0 in | Wt 264.8 lb

## 2023-06-23 DIAGNOSIS — Z7984 Long term (current) use of oral hypoglycemic drugs: Secondary | ICD-10-CM | POA: Diagnosis not present

## 2023-06-23 DIAGNOSIS — E1165 Type 2 diabetes mellitus with hyperglycemia: Secondary | ICD-10-CM

## 2023-06-23 DIAGNOSIS — E7800 Pure hypercholesterolemia, unspecified: Secondary | ICD-10-CM

## 2023-06-23 DIAGNOSIS — Z794 Long term (current) use of insulin: Secondary | ICD-10-CM | POA: Diagnosis not present

## 2023-06-23 MED ORDER — TIRZEPATIDE 5 MG/0.5ML ~~LOC~~ SOAJ: 5.0000 mg | SUBCUTANEOUS | 1 refills | Status: DC

## 2023-06-23 NOTE — Progress Notes (Signed)
Outpatient Endocrinology Note Rachael [place], MD  06/23/23   Rachael Sanford [birth year] [id number]  Referring Provider: Bobbye Morton, * Primary Care Provider: Street, Stephanie Coup, MD Reason for consultation: Subjective   Assessment & Plan  Rand was seen today for follow-up.  Diagnoses and all orders for this visit:  Uncontrolled type 2 diabetes mellitus with hyperglycemia (HCC)  Long-term insulin use (HCC)  Long term (current) use of oral hypoglycemic drugs  Pure hypercholesterolemia  Other orders -     tirzepatide (MOUNJARO) 5 MG/0.5ML Pen; Inject 5 mg into the skin once a week.     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, No results found for: "GFR" Hba1c goal less than 7, current Hba1c is  Lab Results  Component Value Date   HGBA1C 9.2 (A) 05/15/2023   Will recommend the following: Basaglar 75 units bid Humalog 35 units tidac 10 to 15 minutes before meals Metformin 1000 mg bid Start Mounjaro 5 mg weekly, discussed side effects, no contraindications Check blood sugars 3 times daily before meals and bring log next visit to make adjustments in the dose She has blood glucose targets fasting and random Dexcom approved-pt brought to out it on  No known contraindications to any of above medications No history of MEN syndrome/medullary thyroid cancer/pancreatitis or pancreatic cancer in self or family Patient uptodate with eye and foot exam not noted  -Last LD and Tg are as follows: No results found for: "LDLCALC" No results found for: "TRIG" -On pravastatin 40 mg every day, ezetimibe 10 mg every day -Follow low fat diet and exercise   -Blood pressure goal <140/90 - Microalbumin/creatinine goal < 30 -Last MA/Cr is as follows: No results found for: "MICROALBUR", "MALB24HUR" -on ACE/ARB Losartan 100 mg qd -diet changes including salt restriction -limit eating outside -counseled BP targets per standards of diabetes care -uncontrolled blood  pressure can lead to retinopathy, nephropathy and cardiovascular and atherosclerotic heart disease  Reviewed and counseled on: -A1C target -Blood sugar targets -Complications of uncontrolled diabetes  -Checking blood sugar before meals and bedtime and bring log next visit -All medications with mechanism of action and side effects -Hypoglycemia management: rule of 15's, Glucagon Emergency Kit and medical alert ID -low-carb low-fat plate-method diet -At least 20 minutes of physical activity per day -Annual dilated retinal eye exam and foot exam -compliance and follow up needs -follow up as scheduled or earlier if problem gets worse  Call if blood sugar is less than 70 or consistently above 250    Take a 15 gm snack of carbohydrate at bedtime before you go to sleep if your blood sugar is less than 100.    If you are going to fast after midnight for a test or procedure, ask your physician for instructions on how to reduce/decrease your insulin dose.    Call if blood sugar is less than 70 or consistently above 250  -Treating a low sugar by rule of 15  (15 gms of sugar every 15 min until sugar is more than 70) If you feel your sugar is low, test your sugar to be sure If your sugar is low (less than 70), then take 15 grams of a fast acting Carbohydrate (3-4 glucose tablets or glucose gel or 4 ounces of juice or regular soda) Recheck your sugar 15 min after treating low to make sure it is more than 70 If sugar is still less than 70, treat again with 15 grams of carbohydrate  Don't drive the hour of hypoglycemia  If unconscious/unable to eat or drink by mouth, use glucagon injection or nasal spray baqsimi and call 911. Can repeat again in 15 min if still unconscious.  Return in about 3 months (around 09/23/2023).   I have reviewed current medications, nurse's notes, allergies, vital signs, past medical and surgical history, family medical history, and social history for this  encounter. Counseled patient on symptoms, examination findings, lab findings, imaging results, treatment decisions and monitoring and prognosis. The patient understood the recommendations and agrees with the treatment plan. All questions regarding treatment plan were fully answered.  Rachael Madrid, MD  06/23/23    History of Present Illness Rachael Sanford is a 57 y.o. year old female who presents for evaluation of Type II diabetes mellitus.  TOVE KOHLMEIER was first diagnosed in 57 years of age.   Diabetes education +  Home diabetes regimen: Basaglar 75 units bid Humalog 35 units tidac  Metformin 1000 mg bid Mounjaro 2.5 mg weekly  COMPLICATIONS -  MI/Stroke -  retinopathy +  neuropathy -  nephropathy  SYMPTOMS REVIEWED - Polyuria - Weight loss - Blurred vision  BLOOD SUGAR DATA did not check last 2 weeks Per recall, range is 177-333  Reviewed labs: 02/02/2023 TG 79 CHO 134 HDL 53 LDL 65 GFR more than 60 AST 14 ALT 21 Alb/Cr 1 8 A1c 10.3  Physical Exam  BP (!) 115/50   Pulse 69   Ht 5\' 4"  (1.626 m)   Wt 264 lb 12.8 oz (120.1 kg)   LMP 08/31/2017   SpO2 97%   BMI 45.45 kg/m    Constitutional: well developed, well nourished Head: normocephalic, atraumatic Eyes: sclera anicteric, no redness Neck: supple Lungs: normal respiratory effort Neurology: alert and oriented Skin: dry, no appreciable rashes Musculoskeletal: no appreciable defects Psychiatric: normal mood and affect Diabetic Foot Exam - Simple   No data filed      Current Medications Patient's Medications  New Prescriptions   No medications on file  Previous Medications   5-HYDROXYTRYPTOPHAN (5-HTP) 100 MG CAPS    Take by mouth.   ACETAMINOPHEN (TYLENOL) 500 MG TABLET    Take 500 mg by mouth every 8 (eight) hours as needed for mild pain or moderate pain.   ASPIRIN 81 MG TABLET    Take 81 mg by mouth daily.   B COMPLEX VITAMINS CAPSULE    Take 1 capsule by mouth daily.   BIOTIN 09811  MCG TABS    Take 1 tablet by mouth daily.   CALCIUM-MAGNESIUM-ZINC PO    Take 1 tablet by mouth daily.   CINNAMON PO    Take 1-2 tablets by mouth daily. 1,000mg  to 2,000mg    CO-ENZYME Q-10 30 MG CAPSULE    Take 1 capsule by mouth daily.   CONTINUOUS GLUCOSE RECEIVER (DEXCOM G7 RECEIVER) DEVI    1 Device by Does not apply route continuous.   CONTINUOUS GLUCOSE SENSOR (DEXCOM G7 SENSOR) MISC    1 Device by Does not apply route continuous.   DICLOFENAC SODIUM (VOLTAREN) 1 % GEL    Apply 1 application. topically daily as needed for pain.   EZETIMIBE (ZETIA) 10 MG TABLET    Take 10 mg by mouth daily.   FEXOFENADINE (ALLEGRA) 180 MG TABLET    Take 180 mg by mouth daily as needed for allergies or rhinitis.   FUROSEMIDE (LASIX) 20 MG TABLET    Take 0.5-1 tablets by mouth daily as needed for edema.  GATIFLOXACIN (ZYMAXID) 0.5 % SOLN    Place 1 drop into the left eye 4 (four) times daily.   HUMALOG KWIKPEN 100 UNIT/ML KWIKPEN    Inject 35 Units into the skin 3 (three) times daily.   INSULIN GLARGINE (BASAGLAR KWIKPEN) 100 UNIT/ML    Inject 75 Units into the skin 2 (two) times daily.   KETOROLAC (ACULAR) 0.5 % OPHTHALMIC SOLUTION    Place 1 drop into the left eye 4 (four) times daily.   LOSARTAN (COZAAR) 100 MG TABLET    Take 100 mg by mouth daily.   METFORMIN (GLUCOPHAGE) 1000 MG TABLET    Take 1,000 mg by mouth 2 (two) times daily.   METHOCARBAMOL (ROBAXIN) 750 MG TABLET    Take 750 mg by mouth every 8 (eight) hours as needed for muscle spasms.   MULTIPLE VITAMINS-MINERALS (CENTRUM SILVER 50+WOMEN PO)    Take 1 tablet by mouth daily.   POTASSIUM CHLORIDE ER 20 MEQ TBCR    Take 1 tablet by mouth daily.   PRAVASTATIN (PRAVACHOL) 40 MG TABLET    Take 40 mg by mouth daily.   PREDNISOLONE ACETATE (PRED FORTE) 1 % OPHTHALMIC SUSPENSION    Place 1 drop into the left eye 4 (four) times daily.   PSYLLIUM (METAMUCIL PO)    Take 1 Dose by mouth daily.   TURMERIC (QC TUMERIC COMPLEX) 500 MG CAPS    Take 2 tablets  by mouth daily.  Modified Medications   Modified Medication Previous Medication   TIRZEPATIDE (MOUNJARO) 5 MG/0.5ML PEN tirzepatide (MOUNJARO) 5 MG/0.5ML Pen      Inject 5 mg into the skin once a week.    Inject 5 mg into the skin once a week.  Discontinued Medications   No medications on file    Allergies Allergies  Allergen Reactions   Sulfamethoxazole-Trimethoprim Other (See Comments)   Seldane [Terfenadine] Rash   Septra [Bactrim] Rash    Past Medical History Past Medical History:  Diagnosis Date   Acquired deformity of toenail 02/16/2020   Cancer (HCC) 2009   FACIAL MELANOMA   Colon cancer screening 09/17/2022   Constipation 09/17/2022   Diabetes mellitus, type II (HCC) 02/24/2012   Diabetic polyneuropathy associated with type 2 diabetes mellitus (HCC) 09/11/2016   Displacement of lumbar disc with radiculopathy 12/28/2019   Dysmenorrhea 02/24/2012   Dyspnea on exertion 01/20/2019   High cholesterol    History of diabetic ulcer of foot 05/07/2022   Hormone imbalance    Hypertension    Ingrown nail 09/11/2016   Morbid obesity (HCC) 09/17/2022   Obstructive sleep apnea 09/19/2022   Overweight 02/24/2012   PCOS (polycystic ovarian syndrome) 02/24/2012   Plantar callus 02/16/2020   Prominent metatarsal head of left foot 10/03/2021   Pulmonary hypertension, unspecified (HCC) 04/24/2022   Seizure (HCC)    AS AN INFANT   Tinea unguium 05/07/2022   Ulcer of left foot, limited to breakdown of skin (HCC) 01/31/2022    Past Surgical History Past Surgical History:  Procedure Laterality Date   BREAST CYST EXCISION Left    melanoma removal  2009   from face    MOLE REMOVAL     NASAL SINUS SURGERY  10/2018   TRIGGER FINGER RELEASE      Family History family history includes Breast cancer in her mother; Cancer in her maternal uncle, mother, paternal aunt, and paternal grandfather; Diabetes in her father and paternal grandmother; Heart disease in her father and paternal  grandmother; Hyperlipidemia in her father;  Hypertension in her maternal aunt; Macular degeneration in her father and paternal aunt.  Social History Social History   Socioeconomic History   Marital status: Single    Spouse name: Not on file   Number of children: 0   Years of education: Not on file   Highest education level: Not on file  Occupational History   Not on file  Tobacco Use   Smoking status: Never   Smokeless tobacco: Never  Vaping Use   Vaping status: Never Used  Substance and Sexual Activity   Alcohol use: Yes    Comment: seldom   Drug use: No   Sexual activity: Yes    Birth control/protection: Pill  Other Topics Concern   Not on file  Social History Narrative   Not on file   Social Determinants of Health   Financial Resource Strain: Not on file  Food Insecurity: Not on file  Transportation Needs: Not on file  Physical Activity: Not on file  Stress: Not on file  Social Connections: Not on file  Intimate Partner Violence: Not on file    Lab Results  Component Value Date   HGBA1C 9.2 (A) 05/15/2023   No results found for: "CHOL" No results found for: "HDL" No results found for: "LDLCALC" No results found for: "TRIG" No results found for: "CHOLHDL" Lab Results  Component Value Date   CREATININE 0.70 04/24/2022   No results found for: "GFR" No results found for: "MICROALBUR", "MALB24HUR"    Component Value Date/Time   NA 139 04/24/2022 0949   K 5.1 04/24/2022 0949   CL 102 04/24/2022 0949   CO2 24 04/24/2022 0949   GLUCOSE 207 (H) 04/24/2022 0949   BUN 16 04/24/2022 0949   CREATININE 0.70 04/24/2022 0949   CALCIUM 10.1 04/24/2022 0949   PROT 6.2 04/24/2022 0949   ALBUMIN 4.1 04/24/2022 0949   AST 20 04/24/2022 0949   ALT 25 04/24/2022 0949   ALKPHOS 70 04/24/2022 0949   BILITOT 0.2 04/24/2022 0949      Latest Ref Rng & Units 04/24/2022    9:49 AM  BMP  Glucose 70 - 99 mg/dL 784   BUN 6 - 24 mg/dL 16   Creatinine 6.96 - 1.00 mg/dL 2.95    BUN/Creat Ratio 9 - 23 23   Sodium 134 - 144 mmol/L 139   Potassium 3.5 - 5.2 mmol/L 5.1   Chloride 96 - 106 mmol/L 102   CO2 20 - 29 mmol/L 24   Calcium 8.7 - 10.2 mg/dL 28.4     No results found for: "WBC", "RBC", "HGB", "HCT", "PLT", "MCV", "MCH", "MCHC", "RDW", "LYMPHSABS", "MONOABS", "EOSABS", "BASOSABS"   Parts of this note may have been dictated using voice recognition software. There may be variances in spelling and vocabulary which are unintentional. Not all errors are proofread. Please notify the [facility] if any discrepancies are noted or if the meaning of any statement is not clear.

## 2023-07-06 DIAGNOSIS — R0981 Nasal congestion: Secondary | ICD-10-CM | POA: Diagnosis not present

## 2023-07-06 DIAGNOSIS — R051 Acute cough: Secondary | ICD-10-CM | POA: Diagnosis not present

## 2023-08-10 ENCOUNTER — Ambulatory Visit: Payer: BC Managed Care – PPO | Admitting: Skilled Nursing Facility1

## 2023-09-06 DIAGNOSIS — R0981 Nasal congestion: Secondary | ICD-10-CM | POA: Diagnosis not present

## 2023-09-06 DIAGNOSIS — R059 Cough, unspecified: Secondary | ICD-10-CM | POA: Diagnosis not present

## 2023-09-23 ENCOUNTER — Ambulatory Visit: Payer: BC Managed Care – PPO | Admitting: "Endocrinology

## 2023-09-29 DIAGNOSIS — Z6841 Body Mass Index (BMI) 40.0 and over, adult: Secondary | ICD-10-CM | POA: Diagnosis not present

## 2023-09-29 DIAGNOSIS — L089 Local infection of the skin and subcutaneous tissue, unspecified: Secondary | ICD-10-CM | POA: Diagnosis not present

## 2023-10-26 DIAGNOSIS — Z85820 Personal history of malignant melanoma of skin: Secondary | ICD-10-CM | POA: Diagnosis not present

## 2023-10-26 DIAGNOSIS — I872 Venous insufficiency (chronic) (peripheral): Secondary | ICD-10-CM | POA: Diagnosis not present

## 2023-10-26 DIAGNOSIS — L300 Nummular dermatitis: Secondary | ICD-10-CM | POA: Diagnosis not present

## 2023-10-26 DIAGNOSIS — D225 Melanocytic nevi of trunk: Secondary | ICD-10-CM | POA: Diagnosis not present

## 2023-11-16 ENCOUNTER — Ambulatory Visit
Admission: RE | Admit: 2023-11-16 | Discharge: 2023-11-16 | Disposition: A | Discharge status: Home / Self Care | Payer: BC Managed Care – PPO | Source: Ambulatory Visit | Attending: Family Medicine | Admitting: Family Medicine

## 2023-11-16 DIAGNOSIS — Z6841 Body Mass Index (BMI) 40.0 and over, adult: Secondary | ICD-10-CM | POA: Diagnosis not present

## 2023-11-16 DIAGNOSIS — Z1231 Encounter for screening mammogram for malignant neoplasm of breast: Secondary | ICD-10-CM | POA: Diagnosis not present

## 2023-11-16 DIAGNOSIS — Z131 Encounter for screening for diabetes mellitus: Secondary | ICD-10-CM | POA: Diagnosis not present

## 2023-11-16 DIAGNOSIS — E1149 Type 2 diabetes mellitus with other diabetic neurological complication: Secondary | ICD-10-CM | POA: Diagnosis not present

## 2023-11-16 DIAGNOSIS — Z0000 Encounter for general adult medical examination without abnormal findings: Secondary | ICD-10-CM | POA: Diagnosis not present

## 2023-11-26 DIAGNOSIS — G5601 Carpal tunnel syndrome, right upper limb: Secondary | ICD-10-CM | POA: Diagnosis not present

## 2023-11-26 DIAGNOSIS — M65331 Trigger finger, right middle finger: Secondary | ICD-10-CM | POA: Diagnosis not present

## 2023-11-26 DIAGNOSIS — M1811 Unilateral primary osteoarthritis of first carpometacarpal joint, right hand: Secondary | ICD-10-CM | POA: Diagnosis not present

## 2023-12-01 ENCOUNTER — Ambulatory Visit: Payer: BC Managed Care – PPO | Admitting: "Endocrinology

## 2023-12-10 ENCOUNTER — Other Ambulatory Visit: Payer: Self-pay

## 2023-12-10 DIAGNOSIS — E1165 Type 2 diabetes mellitus with hyperglycemia: Secondary | ICD-10-CM

## 2023-12-10 MED ORDER — TIRZEPATIDE 5 MG/0.5ML ~~LOC~~ SOAJ: 5.0000 mg | SUBCUTANEOUS | 1 refills | Status: AC

## 2023-12-24 DIAGNOSIS — G5603 Carpal tunnel syndrome, bilateral upper limbs: Secondary | ICD-10-CM | POA: Diagnosis not present

## 2023-12-24 DIAGNOSIS — M1811 Unilateral primary osteoarthritis of first carpometacarpal joint, right hand: Secondary | ICD-10-CM | POA: Diagnosis not present

## 2023-12-24 DIAGNOSIS — M65331 Trigger finger, right middle finger: Secondary | ICD-10-CM | POA: Diagnosis not present

## 2023-12-31 DIAGNOSIS — I950 Idiopathic hypotension: Secondary | ICD-10-CM | POA: Diagnosis not present

## 2023-12-31 DIAGNOSIS — I10 Essential (primary) hypertension: Secondary | ICD-10-CM | POA: Diagnosis not present

## 2023-12-31 DIAGNOSIS — E1149 Type 2 diabetes mellitus with other diabetic neurological complication: Secondary | ICD-10-CM | POA: Diagnosis not present

## 2023-12-31 DIAGNOSIS — G63 Polyneuropathy in diseases classified elsewhere: Secondary | ICD-10-CM | POA: Diagnosis not present

## 2024-02-15 DIAGNOSIS — M65331 Trigger finger, right middle finger: Secondary | ICD-10-CM | POA: Diagnosis not present

## 2024-02-15 DIAGNOSIS — G5601 Carpal tunnel syndrome, right upper limb: Secondary | ICD-10-CM | POA: Diagnosis not present

## 2024-02-25 DIAGNOSIS — R200 Anesthesia of skin: Secondary | ICD-10-CM | POA: Diagnosis not present

## 2024-02-25 DIAGNOSIS — R202 Paresthesia of skin: Secondary | ICD-10-CM | POA: Diagnosis not present

## 2024-02-25 DIAGNOSIS — M65331 Trigger finger, right middle finger: Secondary | ICD-10-CM | POA: Diagnosis not present

## 2024-02-26 DIAGNOSIS — E1149 Type 2 diabetes mellitus with other diabetic neurological complication: Secondary | ICD-10-CM | POA: Diagnosis not present

## 2024-02-26 DIAGNOSIS — M47819 Spondylosis without myelopathy or radiculopathy, site unspecified: Secondary | ICD-10-CM | POA: Diagnosis not present

## 2024-02-26 DIAGNOSIS — Z79899 Other long term (current) drug therapy: Secondary | ICD-10-CM | POA: Diagnosis not present

## 2024-02-26 DIAGNOSIS — E785 Hyperlipidemia, unspecified: Secondary | ICD-10-CM | POA: Diagnosis not present

## 2024-02-26 DIAGNOSIS — I10 Essential (primary) hypertension: Secondary | ICD-10-CM | POA: Diagnosis not present

## 2024-04-01 DIAGNOSIS — J329 Chronic sinusitis, unspecified: Secondary | ICD-10-CM | POA: Diagnosis not present

## 2024-04-01 DIAGNOSIS — J40 Bronchitis, not specified as acute or chronic: Secondary | ICD-10-CM | POA: Diagnosis not present

## 2024-04-01 DIAGNOSIS — Z6841 Body Mass Index (BMI) 40.0 and over, adult: Secondary | ICD-10-CM | POA: Diagnosis not present

## 2024-04-01 DIAGNOSIS — M545 Low back pain, unspecified: Secondary | ICD-10-CM | POA: Diagnosis not present

## 2024-04-21 DIAGNOSIS — B351 Tinea unguium: Secondary | ICD-10-CM | POA: Diagnosis not present

## 2024-04-21 DIAGNOSIS — M216X2 Other acquired deformities of left foot: Secondary | ICD-10-CM | POA: Diagnosis not present

## 2024-04-21 DIAGNOSIS — E1142 Type 2 diabetes mellitus with diabetic polyneuropathy: Secondary | ICD-10-CM | POA: Diagnosis not present

## 2024-04-21 DIAGNOSIS — L84 Corns and callosities: Secondary | ICD-10-CM | POA: Diagnosis not present

## 2024-04-25 ENCOUNTER — Other Ambulatory Visit: Payer: Self-pay | Admitting: Family Medicine

## 2024-04-25 DIAGNOSIS — Z1231 Encounter for screening mammogram for malignant neoplasm of breast: Secondary | ICD-10-CM

## 2024-05-24 DIAGNOSIS — E663 Overweight: Secondary | ICD-10-CM | POA: Diagnosis not present

## 2024-05-24 DIAGNOSIS — Z6841 Body Mass Index (BMI) 40.0 and over, adult: Secondary | ICD-10-CM | POA: Diagnosis not present

## 2024-05-24 DIAGNOSIS — L03031 Cellulitis of right toe: Secondary | ICD-10-CM | POA: Diagnosis not present

## 2024-05-31 DIAGNOSIS — S39012A Strain of muscle, fascia and tendon of lower back, initial encounter: Secondary | ICD-10-CM | POA: Diagnosis not present

## 2024-06-22 DIAGNOSIS — M79601 Pain in right arm: Secondary | ICD-10-CM | POA: Diagnosis not present

## 2024-06-22 DIAGNOSIS — Z6841 Body Mass Index (BMI) 40.0 and over, adult: Secondary | ICD-10-CM | POA: Diagnosis not present

## 2024-06-23 DIAGNOSIS — E1142 Type 2 diabetes mellitus with diabetic polyneuropathy: Secondary | ICD-10-CM | POA: Diagnosis not present

## 2024-06-23 DIAGNOSIS — M79674 Pain in right toe(s): Secondary | ICD-10-CM | POA: Diagnosis not present

## 2024-06-23 DIAGNOSIS — Z794 Long term (current) use of insulin: Secondary | ICD-10-CM | POA: Diagnosis not present

## 2024-07-07 DIAGNOSIS — G5603 Carpal tunnel syndrome, bilateral upper limbs: Secondary | ICD-10-CM | POA: Diagnosis not present

## 2024-07-07 DIAGNOSIS — M65331 Trigger finger, right middle finger: Secondary | ICD-10-CM | POA: Diagnosis not present

## 2024-08-29 DIAGNOSIS — R0982 Postnasal drip: Secondary | ICD-10-CM | POA: Diagnosis not present

## 2024-08-29 DIAGNOSIS — S46012A Strain of muscle(s) and tendon(s) of the rotator cuff of left shoulder, initial encounter: Secondary | ICD-10-CM | POA: Diagnosis not present

## 2024-08-29 DIAGNOSIS — R0981 Nasal congestion: Secondary | ICD-10-CM | POA: Diagnosis not present

## 2024-09-29 DIAGNOSIS — E1149 Type 2 diabetes mellitus with other diabetic neurological complication: Secondary | ICD-10-CM | POA: Diagnosis not present

## 2024-09-29 DIAGNOSIS — R0981 Nasal congestion: Secondary | ICD-10-CM | POA: Diagnosis not present

## 2024-09-29 DIAGNOSIS — G63 Polyneuropathy in diseases classified elsewhere: Secondary | ICD-10-CM | POA: Diagnosis not present

## 2024-09-29 DIAGNOSIS — E785 Hyperlipidemia, unspecified: Secondary | ICD-10-CM | POA: Diagnosis not present

## 2024-10-07 ENCOUNTER — Ambulatory Visit: Admitting: Cardiology

## 2024-10-13 DIAGNOSIS — E349 Endocrine disorder, unspecified: Secondary | ICD-10-CM | POA: Insufficient documentation

## 2024-10-17 ENCOUNTER — Ambulatory Visit: Attending: Cardiology | Admitting: Cardiology

## 2024-10-17 ENCOUNTER — Encounter: Payer: Self-pay | Admitting: Cardiology

## 2024-10-17 VITALS — BP 120/46 | HR 67 | Ht 66.0 in | Wt 269.2 lb

## 2024-10-17 DIAGNOSIS — E119 Type 2 diabetes mellitus without complications: Secondary | ICD-10-CM

## 2024-10-17 DIAGNOSIS — I272 Pulmonary hypertension, unspecified: Secondary | ICD-10-CM

## 2024-10-17 DIAGNOSIS — I10 Essential (primary) hypertension: Secondary | ICD-10-CM | POA: Diagnosis not present

## 2024-10-17 DIAGNOSIS — G4733 Obstructive sleep apnea (adult) (pediatric): Secondary | ICD-10-CM | POA: Diagnosis not present

## 2024-10-17 DIAGNOSIS — R0609 Other forms of dyspnea: Secondary | ICD-10-CM

## 2024-10-17 DIAGNOSIS — Z794 Long term (current) use of insulin: Secondary | ICD-10-CM

## 2024-10-17 DIAGNOSIS — E7800 Pure hypercholesterolemia, unspecified: Secondary | ICD-10-CM

## 2024-10-17 NOTE — Patient Instructions (Signed)
 Medication Instructions:  Your physician recommends that you continue on your current medications as directed. Please refer to the Current Medication list given to you today.  *If you need a refill on your cardiac medications before your next appointment, please call your pharmacy*   Lab Work: Lipid profile If you have labs (blood work) drawn today and your tests are completely normal, you will receive your results only by: MyChart Message (if you have MyChart) OR A paper copy in the mail If you have any lab test that is abnormal or we need to change your treatment, we will call you to review the results.  Testing/Procedures: Your physician has requested that you have an echocardiogram. Echocardiography is a painless test that uses sound waves to create images of your heart. It provides your doctor with information about the size and shape of your heart and how well your heart's chambers and valves are working. This procedure takes approximately one hour. There are no restrictions for this procedure. Please do NOT wear cologne, perfume, aftershave, or lotions (deodorant is allowed). Please arrive 15 minutes prior to your appointment time.  Please note: We ask at that you not bring children with you during ultrasound (echo/ vascular) testing. Due to room size and safety concerns, children are not allowed in the ultrasound rooms during exams. Our front office staff cannot provide observation of children in our lobby area while testing is being conducted. An adult accompanying a patient to their appointment will only be allowed in the ultrasound room at the discretion of the ultrasound technician under special circumstances. We apologize for any inconvenience.  Follow-Up: At [facility], you and your health needs are our priority.  As part of our continuing mission to provide you with exceptional heart care, we have created designated Provider Care Teams.  These Care Teams include your primary  Cardiologist (physician) and Advanced Practice Providers (APPs -  Physician Assistants and Nurse Practitioners) who all work together to provide you with the care you need, when you need it.  We recommend signing up for the patient portal called MyChart.  Sign up information is provided on this After Visit Summary.  MyChart is used to connect with patients for Virtual Visits (Telemedicine).  Patients are able to view lab/test results, encounter notes, upcoming appointments, etc.  Non-urgent messages can be sent to your provider as well.   To learn more about what you can do with MyChart, go to [email or web address].    Your next appointment:   6 month(s)  The format for your next appointment:   In Person  Provider:   Lamar Fitch, MD   Other Instructions Echocardiogram An echocardiogram is a test that uses sound waves (ultrasound) to produce images of the heart. Images from an echocardiogram can provide important information about: Heart size and shape. The size and thickness and movement of your heart's walls. Heart muscle function and strength. Heart valve function or if you have stenosis. Stenosis is when the heart valves are too narrow. If blood is flowing backward through the heart valves (regurgitation). A tumor or infectious growth around the heart valves. Areas of heart muscle that are not working well because of poor blood flow or injury from a heart attack. Aneurysm detection. An aneurysm is a weak or damaged part of an artery wall. The wall bulges out from the normal force of blood pumping through the body. Tell a health care provider about: Any allergies you have. All medicines you are taking, including vitamins, herbs,  eye drops, creams, and over-the-counter medicines. Any blood disorders you have. Any surgeries you have had. Any medical conditions you have. Whether you are pregnant or may be pregnant. What are the risks? Generally, this is a safe test.  However, problems may occur, including an allergic reaction to dye (contrast) that may be used during the test. What happens before the test? No specific preparation is needed. You may eat and drink normally. What happens during the test? You will take off your clothes from the waist up and put on a hospital gown. Electrodes or electrocardiogram (ECG)patches may be placed on your chest. The electrodes or patches are then connected to a device that monitors your heart rate and rhythm. You will lie down on a table for an ultrasound exam. A gel will be applied to your chest to help sound waves pass through your skin. A handheld device, called a transducer, will be pressed against your chest and moved over your heart. The transducer produces sound waves that travel to your heart and bounce back (or echo back) to the transducer. These sound waves will be captured in real-time and changed into images of your heart that can be viewed on a video monitor. The images will be recorded on a computer and reviewed by your health care provider. You may be asked to change positions or hold your breath for a short time. This makes it easier to get different views or better views of your heart. In some cases, you may receive contrast through an IV in one of your veins. This can improve the quality of the pictures from your heart. The procedure may vary among health care providers and hospitals.   What can I expect after the test? You may return to your normal, everyday life, including diet, activities, and medicines, unless your health care provider tells you not to do that. Follow these instructions at home: It is up to you to get the results of your test. Ask your health care provider, or the department that is doing the test, when your results will be ready. Keep all follow-up visits. This is important. Summary An echocardiogram is a test that uses sound waves (ultrasound) to produce images of the heart. Images  from an echocardiogram can provide important information about the size and shape of your heart, heart muscle function, heart valve function, and other possible heart problems. You do not need to do anything to prepare before this test. You may eat and drink normally. After the echocardiogram is completed, you may return to your normal, everyday life, unless your health care provider tells you not to do that. This information is not intended to replace advice given to you by your health care provider. Make sure you discuss any questions you have with your health care provider. Document Revised: 07/03/2020 Document Reviewed: 07/03/2020 Elsevier Patient Education  2021 [employer]   Important Information About Sugar

## 2024-10-17 NOTE — Progress Notes (Signed)
 Cardiology Office Note:    Date:  10/17/2024   ID:  Rachael Sanford, DOB November 01, 1966, MRN [id number]  PCP:  Street, Lonni HERO, MD  Cardiologist:  Lamar Fitch, MD    Referring MD: [address], Lonni HERO, *   No chief complaint on file. Doing fine  History of Present Illness:    Rachael Sanford is a 58 y.o. female past medical history significant for pulmonary hypertension with pulm hypertension 50 mmHg, morbid obesity, diabetes, dyslipidemia, polycystic ovarian, essential hypertension.  She comes to my office for follow-up.  Last time I seen her she was diagnosed with sleep apnea she was waiting for CPAP.  She was very happy and satisfied with this and she actually lost some weight.  However when she started using CPAP while she could not tolerate this and now she does not use it.  She inquired about the different measures to take care of her obstructive sleep apnea.  Denies have any chest pain tightness squeezing pressure burning chest press fatigue tiredness and shortness of breath.  Past Medical History:  Diagnosis Date   Acquired deformity of toenail 02/16/2020   Cancer (HCC) 2009   FACIAL MELANOMA   Colon cancer screening 09/17/2022   Constipation 09/17/2022   Diabetes mellitus, type II (HCC) 02/24/2012   Diabetic polyneuropathy associated with type 2 diabetes mellitus (HCC) 09/11/2016   Displacement of lumbar disc with radiculopathy 12/28/2019   Dysmenorrhea 02/24/2012   Dyspnea on exertion 01/20/2019   High cholesterol    History of diabetic ulcer of foot 05/07/2022   Hormone imbalance    Hypertension    Ingrown nail 09/11/2016   Morbid obesity (HCC) 09/17/2022   Obstructive sleep apnea 09/19/2022   Overweight 02/24/2012   PCOS (polycystic ovarian syndrome) 02/24/2012   Plantar callus 02/16/2020   Prominent metatarsal head of left foot 10/03/2021   Pulmonary hypertension, unspecified (HCC) 04/24/2022   Seizure (HCC)    AS AN INFANT   Tinea unguium 05/07/2022    Ulcer of left foot, limited to breakdown of skin (HCC) 01/31/2022    Past Surgical History:  Procedure Laterality Date   BREAST CYST EXCISION Left    melanoma removal  2009   from face    MOLE REMOVAL     NASAL SINUS SURGERY  10/2018   TRIGGER FINGER RELEASE      Current Medications: Current Meds  Medication Sig   acetaminophen (TYLENOL) 500 MG tablet Take 500 mg by mouth every 8 (eight) hours as needed for mild pain or moderate pain.   aspirin 81 MG tablet Take 81 mg by mouth daily.   b complex vitamins capsule Take 1 capsule by mouth daily.   Biotin 89999 MCG TABS Take 1 tablet by mouth daily.   CALCIUM-MAGNESIUM-ZINC PO Take 1 tablet by mouth daily.   CINNAMON PO Take 1-2 tablets by mouth daily. 1,000mg  to 2,000mg    Continuous Glucose Receiver (DEXCOM G7 RECEIVER) DEVI 1 Device by Does not apply route continuous.   Continuous Glucose Sensor (DEXCOM G7 SENSOR) MISC 1 Device by Does not apply route continuous.   diclofenac Sodium (VOLTAREN) 1 % GEL Apply 1 application. topically daily as needed for pain.   ezetimibe (ZETIA) 10 MG tablet Take 10 mg by mouth daily.   fexofenadine (ALLEGRA) 180 MG tablet Take 180 mg by mouth daily as needed for allergies or rhinitis.   furosemide (LASIX) 20 MG tablet Take 0.5-1 tablets by mouth daily as needed for edema.   HUMALOG KWIKPEN 100 UNIT/ML  KwikPen Inject 35 Units into the skin 3 (three) times daily.   Insulin  Glargine (BASAGLAR KWIKPEN) 100 UNIT/ML Inject 75 Units into the skin 2 (two) times daily.   KLOR-CON  M20 20 MEQ tablet Take 20 mEq by mouth as needed.   losartan (COZAAR) 50 MG tablet Take 50 mg by mouth daily.   metFORMIN (GLUCOPHAGE) 1000 MG tablet Take 1,000 mg by mouth 2 (two) times daily.   methocarbamol  (ROBAXIN ) 750 MG tablet Take 750 mg by mouth every 8 (eight) hours as needed for muscle spasms.   Multiple Vitamins-Minerals (CENTRUM SILVER 50+WOMEN PO) Take 1 tablet by mouth daily.   pravastatin (PRAVACHOL) 40 MG tablet Take  40 mg by mouth daily.   Psyllium (METAMUCIL PO) Take 1 Dose by mouth daily.   tirzepatide  (MOUNJARO ) 5 MG/0.5ML Pen Inject 5 mg into the skin once a week.   Turmeric (QC TUMERIC COMPLEX) 500 MG CAPS Take 2 tablets by mouth daily.   [DISCONTINUED] losartan (COZAAR) 100 MG tablet Take 100 mg by mouth daily.     Allergies:   Sulfamethoxazole-trimethoprim, Seldane [terfenadine], and Septra [bactrim]   Social History   Socioeconomic History   Marital status: Single    Spouse name: Not on file   Number of children: 0   Years of education: Not on file   Highest education level: Not on file  Occupational History   Not on file  Tobacco Use   Smoking status: Never   Smokeless tobacco: Never  Vaping Use   Vaping status: Never Used  Substance and Sexual Activity   Alcohol use: Yes    Comment: seldom   Drug use: No   Sexual activity: Yes    Birth control/protection: Pill  Other Topics Concern   Not on file  Social History Narrative   Not on file   Social Drivers of Health   Financial Resource Strain: Not on file  Food Insecurity: Low Risk  (06/23/2024)   Received from [facility]   Hunger Vital Sign    Within the past 12 months, you worried that your food would run out before you got money to buy more: Never true    Within the past 12 months, the food you bought just didn't last and you didn't have money to get more. : Never true  Transportation Needs: No Transportation Needs (06/23/2024)   Received from [employer]    In the past 12 months, has lack of reliable transportation kept you from medical appointments, meetings, work or from getting things needed for daily living? : No  Physical Activity: Not on file  Stress: Not on file  Social Connections: Not on file     Family History: The patient's family history includes Breast cancer in her mother; Cancer in her maternal uncle, mother, paternal aunt, and paternal grandfather; Diabetes in her father and  paternal grandmother; Heart disease in her father and paternal grandmother; Hyperlipidemia in her father; Hypertension in her maternal aunt; Macular degeneration in her father and paternal aunt. ROS:   Please see the history of present illness.    All 14 point review of systems negative except as described per history of present illness  EKGs/Labs/Other Studies Reviewed:    EKG Interpretation Date/Time:  Monday October 17 2024 15:57:58 EST Ventricular Rate:  67 PR Interval:  168 QRS Duration:  102 QT Interval:  404 QTC Calculation: 426 R Axis:   -42  Text Interpretation: Normal sinus rhythm Left axis deviation Low voltage QRS Cannot  rule out Inferior infarct , age undetermined Abnormal ECG No previous ECGs available Confirmed by Bernie Charleston [phone]) on 10/17/2024 4:02:56 PM    Recent Labs: No results found for requested labs within last 365 days.  Recent Lipid Panel No results found for: CHOL, TRIG, HDL, CHOLHDL, VLDL, LDLCALC, LDLDIRECT  Physical Exam:    VS:  BP (!) 120/46   Pulse 67   Ht 5' 6 (1.676 m)   Wt 269 lb 3.2 oz (122.1 kg)   LMP 08/31/2017   SpO2 97%   BMI 43.45 kg/m     Wt Readings from Last 3 Encounters:  10/17/24 269 lb 3.2 oz (122.1 kg)  06/23/23 264 lb 12.8 oz (120.1 kg)  05/15/23 269 lb 12.8 oz (122.4 kg)     GEN:  Well nourished, well developed in no acute distress HEENT: Normal NECK: No JVD; No carotid bruits LYMPHATICS: No lymphadenopathy CARDIAC: RRR, no murmurs, no rubs, no gallops RESPIRATORY:  Clear to auscultation without rales, wheezing or rhonchi  ABDOMEN: Soft, non-tender, non-distended MUSCULOSKELETAL:  No edema; No deformity  SKIN: Warm and dry LOWER EXTREMITIES: no swelling NEUROLOGIC:  Alert and oriented x 3 PSYCHIATRIC:  Normal affect   ASSESSMENT:    1. Primary hypertension   2. Pulmonary hypertension, unspecified (HCC)   3. Obstructive sleep apnea   4. Type 2 diabetes mellitus without complication,  with long-term current use of insulin  (HCC)   5. Dyspnea on exertion   6. High cholesterol    PLAN:    In order of problems listed above:  Essential hypertension: Blood pressure well-controlled continue present management. Pulmonary hypertension.  Will recheck echocardiogram to recheck on that. Obstructive sleep apnea does not use CPAP muscular refer her back to our sleep specialist to talk about options. Type 2 diabetes not well-controlled her hemoglobin A1c is 8.8 I talked to her in length about this I told to her how important it is to take care of this, I strongly recommended to get CGM to help to put her on control regimen. Dyspnea on exertion echocardiogram will be done. Dyslipidemia fasting ibuprofen will be checked   Medication Adjustments/Labs and Tests Ordered: Current medicines are reviewed at length with the patient today.  Concerns regarding medicines are outlined above.  Orders Placed This Encounter  Procedures   EKG 12-Lead   Medication changes: No orders of the defined types were placed in this encounter.   Signed, [place] DOROTHA Bernie, MD, [facility] 10/17/2024 4:15 PM    [facility]

## 2024-10-17 NOTE — Addendum Note (Signed)
 Addended by: GLENFORD ALAN CROME on: 10/17/2024 04:21 PM   Modules accepted: Orders

## 2024-10-18 ENCOUNTER — Ambulatory Visit: Attending: Cardiology

## 2024-10-18 DIAGNOSIS — R0609 Other forms of dyspnea: Secondary | ICD-10-CM

## 2024-10-18 LAB — ECHOCARDIOGRAM COMPLETE
Area-P 1/2: 4.49 cm2
S' Lateral: 3.8 cm

## 2024-10-18 MED ORDER — PERFLUTREN LIPID MICROSPHERE
1.0000 mL | INTRAVENOUS | Status: AC | PRN
  Administered 2024-10-18: 10 mL via INTRAVENOUS

## 2024-10-19 ENCOUNTER — Ambulatory Visit: Payer: Self-pay | Admitting: Cardiology

## 2024-10-26 DIAGNOSIS — Z794 Long term (current) use of insulin: Secondary | ICD-10-CM | POA: Diagnosis not present

## 2024-10-26 DIAGNOSIS — Z85820 Personal history of malignant melanoma of skin: Secondary | ICD-10-CM | POA: Diagnosis not present

## 2024-10-26 DIAGNOSIS — D485 Neoplasm of uncertain behavior of skin: Secondary | ICD-10-CM | POA: Diagnosis not present

## 2024-10-26 DIAGNOSIS — D225 Melanocytic nevi of trunk: Secondary | ICD-10-CM | POA: Diagnosis not present

## 2024-10-26 DIAGNOSIS — S91311A Laceration without foreign body, right foot, initial encounter: Secondary | ICD-10-CM | POA: Diagnosis not present

## 2024-10-26 DIAGNOSIS — E7800 Pure hypercholesterolemia, unspecified: Secondary | ICD-10-CM | POA: Diagnosis not present

## 2024-10-26 DIAGNOSIS — L814 Other melanin hyperpigmentation: Secondary | ICD-10-CM | POA: Diagnosis not present

## 2024-10-26 DIAGNOSIS — L821 Other seborrheic keratosis: Secondary | ICD-10-CM | POA: Diagnosis not present

## 2024-10-26 DIAGNOSIS — E1142 Type 2 diabetes mellitus with diabetic polyneuropathy: Secondary | ICD-10-CM | POA: Diagnosis not present

## 2024-10-27 LAB — LIPID PANEL
Chol/HDL Ratio: 2.8 ratio (ref 0.0–4.4)
Cholesterol, Total: 157 mg/dL (ref 100–199)
HDL: 57 mg/dL (ref >39)
LDL Chol Calc (NIH): 83 mg/dL (ref 0–99)
Triglycerides: 90 mg/dL (ref 0–149)
VLDL Cholesterol Cal: 17 mg/dL (ref 5–40)

## 2024-11-16 ENCOUNTER — Ambulatory Visit: Admitting: Pulmonary Disease

## 2024-11-16 ENCOUNTER — Encounter: Payer: Self-pay | Admitting: Pulmonary Disease

## 2024-11-16 VITALS — BP 136/73 | HR 70 | Temp 98.1°F | Ht 66.0 in | Wt 270.4 lb

## 2024-11-16 DIAGNOSIS — Z6841 Body Mass Index (BMI) 40.0 and over, adult: Secondary | ICD-10-CM | POA: Insufficient documentation

## 2024-11-16 DIAGNOSIS — R0683 Snoring: Secondary | ICD-10-CM | POA: Diagnosis not present

## 2024-11-16 DIAGNOSIS — J3089 Other allergic rhinitis: Secondary | ICD-10-CM | POA: Insufficient documentation

## 2024-11-16 NOTE — Progress Notes (Signed)
 "  Synopsis: Referred in 09/2024 for OSA by Bernie Lamar PARAS, MD  Subjective:   PATIENT ID: Rachael Sanford GENDER: female DOB: 06-Jun-1966, MRN: [id number]  Chief Complaint  Patient presents with   Establish Care    New patient     HPI  Rachael Sanford is a 58 y.o. patient with T2DM, PCOS, HTN, obesity, and prior diagnosis of severe OSA several years ago (AHI 30/hr, T88% 120 mins, SpO2 nadir 68%) who presents today to establish care in Pulmonary Clinic.  She was previously seen by Dr. Shlomo at [facility]. Review of Dr. Dorine note in Care Everywhere yield this information: sleep study details listed above; and she was prescribed CPAP 4-15 cm H2O with a FFM; she did use CPAP therapy for about 18 months before she stopped using it due to struggling to become comfortable with the FFM. While on treatment, she did notice significant improvement in EDS symptoms and her nocturia and frequency of nighttime awakenings. She never tried a nasal pillows mask.  Regarding her weight, we discussed the use of Zepbound  for weight loss. She is already on Mounjaro  5 mg dose, prescribed by her endocrinologist for her T2DM. She is not sure whether there is a plan to increase the dose further. I reviewed Dr. Renard note but it does not state either way. She is losing some weight already on this dose, though her BMI remains > 43 kg/m2. Weight loss is an important goal for her.  She expressed some concerns about whether or not she will tolerate CPAP better the second time around or whether she would be better off to try Inspire (hypoglossal nerve stimulation).  She does experience near-perennial allergy  symptoms including scratchy throat and itchy eyes and nasal congestion. She takes a second generation antihistamine for these symptoms -- she takes this most of the year.  She has used Flonase sporadically in the past but not consistently. Counseling provided regarding the length of time Flonase takes to  work.  She is a non-smoker.  Pulm Questionnaires:      No data to display           Past Medical History:  Diagnosis Date   Acquired deformity of toenail 02/16/2020   Cancer (HCC) 2009   FACIAL MELANOMA   Colon cancer screening 09/17/2022   Constipation 09/17/2022   Diabetes mellitus, type II (HCC) 02/24/2012   Diabetic polyneuropathy associated with type 2 diabetes mellitus (HCC) 09/11/2016   Displacement of lumbar disc with radiculopathy 12/28/2019   Dysmenorrhea 02/24/2012   Dyspnea on exertion 01/20/2019   High cholesterol    History of diabetic ulcer of foot 05/07/2022   Hormone imbalance    Hypertension    Ingrown nail 09/11/2016   Morbid obesity (HCC) 09/17/2022   Obstructive sleep apnea 09/19/2022   Overweight 02/24/2012   PCOS (polycystic ovarian syndrome) 02/24/2012   Plantar callus 02/16/2020   Prominent metatarsal head of left foot 10/03/2021   Pulmonary hypertension, unspecified (HCC) 04/24/2022   Seizure (HCC)    AS AN INFANT   Tinea unguium 05/07/2022   Ulcer of left foot, limited to breakdown of skin (HCC) 01/31/2022     Family History  Problem Relation Age of Onset   Breast cancer Mother    Cancer Mother        LIVER AND GALLBLADDER   Diabetes Father    Heart disease Father    Macular degeneration Father    Hyperlipidemia Father    Hypertension Maternal Aunt  Cancer Paternal Aunt        OVARIAN   Macular degeneration Paternal Aunt    Diabetes Paternal Grandmother    Heart disease Paternal Grandmother    Cancer Paternal Grandfather        COLON   Cancer Maternal Uncle        liver and pancreas     Past Surgical History:  Procedure Laterality Date   BREAST CYST EXCISION Left    melanoma removal  2009   from face    MOLE REMOVAL     NASAL SINUS SURGERY  10/2018   TRIGGER FINGER RELEASE      Social History   Socioeconomic History   Marital status: Single    Spouse name: Not on file   Number of children: 0   Years of  education: Not on file   Highest education level: Not on file  Occupational History   Not on file  Tobacco Use   Smoking status: Never   Smokeless tobacco: Never  Vaping Use   Vaping status: Never Used  Substance and Sexual Activity   Alcohol use: Yes    Comment: seldom   Drug use: No   Sexual activity: Yes    Birth control/protection: Pill  Other Topics Concern   Not on file  Social History Narrative   Not on file   Social Drivers of Health   Tobacco Use: Low Risk (11/16/2024)   Patient History    Smoking Tobacco Use: Never    Smokeless Tobacco Use: Never    Passive Exposure: Not on file  Financial Resource Strain: Not on file  Food Insecurity: Low Risk (06/23/2024)   Received from [facility]   Epic    Within the past 12 months, you worried that your food would run out before you got money to buy more: Never true    Within the past 12 months, the food you bought just didn't last and you didn't have money to get more. : Never true  Transportation Needs: No Transportation Needs (06/23/2024)   Received from [employer]    In the past 12 months, has lack of reliable transportation kept you from medical appointments, meetings, work or from getting things needed for daily living? : No  Physical Activity: Not on file  Stress: Not on file  Social Connections: Not on file  Intimate Partner Violence: Not on file  Depression (EYV7-0): Not on file  Alcohol Screen: Not on file  Housing: Low Risk (06/23/2024)   Received from [facility]   Epic    What is your living situation today?: I have a steady place to live    Think about the place you live. Do you have problems with any of the following? Choose all that apply:: None/None on this list  Utilities: Low Risk (06/23/2024)   Received from [facility]   Utilities    In the past 12 months has the electric, gas, oil, or water company threatened to shut off services in your home? : No  Health Literacy: Not on  file     Allergies[1]   Outpatient Medications Prior to Visit  Medication Sig Dispense Refill   acetaminophen (TYLENOL) 500 MG tablet Take 500 mg by mouth every 8 (eight) hours as needed for mild pain or moderate pain.     aspirin 81 MG tablet Take 81 mg by mouth daily.     b complex vitamins capsule Take 1 capsule by mouth daily.  Biotin 89999 MCG TABS Take 1 tablet by mouth daily.     CALCIUM-MAGNESIUM-ZINC PO Take 1 tablet by mouth daily.     CINNAMON PO Take 1-2 tablets by mouth daily. 1,000mg  to 2,000mg      Continuous Glucose Receiver (DEXCOM G7 RECEIVER) DEVI 1 Device by Does not apply route continuous. 1 each 0   Continuous Glucose Sensor (DEXCOM G7 SENSOR) MISC 1 Device by Does not apply route continuous. 9 each 0   diclofenac Sodium (VOLTAREN) 1 % GEL Apply 1 application. topically daily as needed for pain.     ezetimibe (ZETIA) 10 MG tablet Take 10 mg by mouth daily.     fexofenadine (ALLEGRA) 180 MG tablet Take 180 mg by mouth daily as needed for allergies or rhinitis.     furosemide (LASIX) 20 MG tablet Take 0.5-1 tablets by mouth daily as needed for edema.     HUMALOG KWIKPEN 100 UNIT/ML KwikPen Inject 35 Units into the skin 3 (three) times daily.     Insulin  Glargine (BASAGLAR KWIKPEN) 100 UNIT/ML Inject 75 Units into the skin 2 (two) times daily.     KLOR-CON  M20 20 MEQ tablet Take 20 mEq by mouth as needed.     losartan (COZAAR) 50 MG tablet Take 50 mg by mouth daily.     metFORMIN (GLUCOPHAGE) 1000 MG tablet Take 1,000 mg by mouth 2 (two) times daily.     methocarbamol  (ROBAXIN ) 750 MG tablet Take 750 mg by mouth every 8 (eight) hours as needed for muscle spasms.     Multiple Vitamins-Minerals (CENTRUM SILVER 50+WOMEN PO) Take 1 tablet by mouth daily.     pravastatin (PRAVACHOL) 40 MG tablet Take 40 mg by mouth daily.     Psyllium (METAMUCIL PO) Take 1 Dose by mouth daily.     tirzepatide  (MOUNJARO ) 5 MG/0.5ML Pen Inject 5 mg into the skin once a week. 6 mL 1    Turmeric (QC TUMERIC COMPLEX) 500 MG CAPS Take 2 tablets by mouth daily.     No facility-administered medications prior to visit.    ROS   Objective:  Physical Exam Constitutional:      General: She is not in acute distress.    Appearance: Normal appearance. She is obese.  HENT:     Head: Normocephalic and atraumatic.     Mouth/Throat:     Mouth: Mucous membranes are moist.     Pharynx: Oropharynx is clear. No oropharyngeal exudate or posterior oropharyngeal erythema.     Comments: MP IV Eyes:     General: No scleral icterus.    Conjunctiva/sclera: Conjunctivae normal.  Musculoskeletal:     Right lower leg: Edema present.     Left lower leg: Edema present.     Comments: Trace bilateral LE edema  Neurological:     Mental Status: She is alert and oriented to person, place, and time.  Psychiatric:        Thought Content: Thought content normal.      Vitals:   11/16/24 1029  BP: 136/73  Pulse: 70  Temp: 98.1 F (36.7 C)  SpO2: 95%  Weight: 270 lb 6.4 oz (122.7 kg)  Height: 5' 6 (1.676 m)   95% on RA BMI Readings from Last 3 Encounters:  11/16/24 43.64 kg/m  10/17/24 43.45 kg/m  06/23/23 45.45 kg/m   Wt Readings from Last 3 Encounters:  11/16/24 270 lb 6.4 oz (122.7 kg)  10/17/24 269 lb 3.2 oz (122.1 kg)  06/23/23 264 lb 12.8 oz (120.1  kg)     CBC No results found for: WBC, RBC, HGB, HCT, PLT, MCV, MCH, MCHC, RDW, LYMPHSABS, MONOABS, EOSABS, BASOSABS   Chest Imaging: N/A  Pulmonary Functions Testing Results:     No data to display          FeNO: N/A  Pathology: N/A  Echocardiogram:  TTE (10/18/2024):  1. Left ventricular ejection fraction, by estimation, is 60 to 65%. The  left ventricle has normal function. The left ventricle has no regional  wall motion abnormalities. There is mild left ventricular hypertrophy.  Left ventricular diastolic parameters  are consistent with Grade II diastolic dysfunction  (pseudonormalization).   2. Right ventricular systolic function is normal. The right ventricular  size is normal. There is mildly elevated pulmonary artery systolic pressure.   3. Left atrial size was mild to moderately dilated.   4. The mitral valve is normal in structure. No evidence of mitral valve  regurgitation. No evidence of mitral stenosis.   5. The aortic valve is normal in structure. Aortic valve regurgitation is  not visualized. No aortic stenosis is present.   6. The inferior vena cava is normal in size with greater than 50%  respiratory variability, suggesting right atrial pressure of 3 mmHg.    Heart Catheterization: N/A  Sleep Studies: Prior WatchPAT study in 06/28/2022 which showed severe OSA with AHI of 30/hr, T88% 120 minutes, SpO2 nadir 68%.    Assessment & Plan:     ICD-10-CM   1. Snoring  R06.83 Home sleep test    2. Morbid (severe) obesity due to excess calories (HCC)  E66.01     3. BMI 40.0-44.9, adult (HCC)  Z68.41     4. Non-seasonal allergic rhinitis, unspecified trigger  J30.89       Discussion:  - Ordered HST to re-qualify for CPAP therapy.  - Once HST result is available, will Rx AutoCPAP 8-16 cm H2O with P-10 nasal pillows mask + chin strap to DME company.  - Counseled patient about the results of the [facility]-OSA trial which showed a 20% reduction in body mass in the first year of Zepbound  at maximum tolerated dose, and achievement of a 50% reduction in the AHI (severity measure of OSA) over the same period of time. She is already on Mounjaro  5 mg weekly for her diabetes. I will send a message to her endocrinologist that, at least from a sleep medicine perspective, it would be beneficial to increase the dose of tirzepatide  further -- especially since she is tolerating this well and exhibiting some response already in terms of weight loss. While she may require CPAP long-term regardless, significant weight loss will increase the tolerability of CPAP by  improving the efficacy of lower pressures.  - Recommend using Flonase 2 sprays per nare once daily. Start now in anticipation of using nasal pillows with CPAP.  - Continue to use second generation antihistamine for control of perennial allergy  symptoms.   Current Medications[2]  Follow up: To be scheduled when our office calls the patient to report the results of their home sleep test and next steps.  MDM Level: Moderate  Lamar Dales, MD Pulmonary, Critical Care & Sleep Medicine [facility] 11/16/2024 11:38 AM     [1]  Allergies Allergen Reactions   Sulfamethoxazole-Trimethoprim Other (See Comments)   Seldane [Terfenadine] Rash   Septra [Bactrim] Rash  [2]  Current Outpatient Medications:    acetaminophen (TYLENOL) 500 MG tablet, Take 500 mg by mouth every 8 (eight) hours as needed  for mild pain or moderate pain., Disp: , Rfl:    aspirin 81 MG tablet, Take 81 mg by mouth daily., Disp: , Rfl:    b complex vitamins capsule, Take 1 capsule by mouth daily., Disp: , Rfl:    Biotin 89999 MCG TABS, Take 1 tablet by mouth daily., Disp: , Rfl:    CALCIUM-MAGNESIUM-ZINC PO, Take 1 tablet by mouth daily., Disp: , Rfl:    CINNAMON PO, Take 1-2 tablets by mouth daily. 1,000mg  to 2,000mg , Disp: , Rfl:    Continuous Glucose Receiver (DEXCOM G7 RECEIVER) DEVI, 1 Device by Does not apply route continuous., Disp: 1 each, Rfl: 0   Continuous Glucose Sensor (DEXCOM G7 SENSOR) MISC, 1 Device by Does not apply route continuous., Disp: 9 each, Rfl: 0   diclofenac Sodium (VOLTAREN) 1 % GEL, Apply 1 application. topically daily as needed for pain., Disp: , Rfl:    ezetimibe (ZETIA) 10 MG tablet, Take 10 mg by mouth daily., Disp: , Rfl:    fexofenadine (ALLEGRA) 180 MG tablet, Take 180 mg by mouth daily as needed for allergies or rhinitis., Disp: , Rfl:    furosemide (LASIX) 20 MG tablet, Take 0.5-1 tablets by mouth daily as needed for edema., Disp: , Rfl:    HUMALOG KWIKPEN 100 UNIT/ML  KwikPen, Inject 35 Units into the skin 3 (three) times daily., Disp: , Rfl:    Insulin  Glargine (BASAGLAR KWIKPEN) 100 UNIT/ML, Inject 75 Units into the skin 2 (two) times daily., Disp: , Rfl:    KLOR-CON  M20 20 MEQ tablet, Take 20 mEq by mouth as needed., Disp: , Rfl:    losartan (COZAAR) 50 MG tablet, Take 50 mg by mouth daily., Disp: , Rfl:    metFORMIN (GLUCOPHAGE) 1000 MG tablet, Take 1,000 mg by mouth 2 (two) times daily., Disp: , Rfl:    methocarbamol  (ROBAXIN ) 750 MG tablet, Take 750 mg by mouth every 8 (eight) hours as needed for muscle spasms., Disp: , Rfl:    Multiple Vitamins-Minerals (CENTRUM SILVER 50+WOMEN PO), Take 1 tablet by mouth daily., Disp: , Rfl:    pravastatin (PRAVACHOL) 40 MG tablet, Take 40 mg by mouth daily., Disp: , Rfl:    Psyllium (METAMUCIL PO), Take 1 Dose by mouth daily., Disp: , Rfl:    tirzepatide  (MOUNJARO ) 5 MG/0.5ML Pen, Inject 5 mg into the skin once a week., Disp: 6 mL, Rfl: 1   Turmeric (QC TUMERIC COMPLEX) 500 MG CAPS, Take 2 tablets by mouth daily., Disp: , Rfl:   "

## 2024-11-16 NOTE — Patient Instructions (Signed)
" °  You will receive a call to schedule your Sleep Study.  ----------   There is no need to schedule a follow up visit today. Our office will call you with the results and next steps, and you will schedule your next visit at that time.  ----------   I will contact your endocrinologist to discuss whether there is a plan or possibility of increasing your Mounjaro  to better treat your obstructive sleep apnea.  ----------  Start using Flonase 2 sprays per side once daily. You need to use this every day and very consistently and for at least 1 month before any effects will be noticed.  ----------  Continue to use a non-drowsy anti-histamine for your allergy  symptoms.  ---------- "

## 2024-11-18 ENCOUNTER — Ambulatory Visit
Admission: RE | Admit: 2024-11-18 | Discharge: 2024-11-18 | Disposition: A | Discharge status: Home / Self Care | Source: Ambulatory Visit | Attending: Family Medicine | Admitting: Family Medicine

## 2024-11-18 DIAGNOSIS — R0981 Nasal congestion: Secondary | ICD-10-CM | POA: Diagnosis not present

## 2024-11-18 DIAGNOSIS — R051 Acute cough: Secondary | ICD-10-CM | POA: Diagnosis not present

## 2024-11-18 DIAGNOSIS — Z1231 Encounter for screening mammogram for malignant neoplasm of breast: Secondary | ICD-10-CM | POA: Diagnosis not present

## 2024-11-18 DIAGNOSIS — J0100 Acute maxillary sinusitis, unspecified: Secondary | ICD-10-CM | POA: Diagnosis not present

## 2025-01-05 ENCOUNTER — Ambulatory Visit: Admitting: "Endocrinology
